# Patient Record
Sex: Female | Born: 1970 | Race: White | Hispanic: No | Marital: Married | Smoking: Never smoker
Health system: Southern US, Community
[De-identification: ages and names within clinical notes are randomized; demographics above are authoritative.]

## PROBLEM LIST (undated history)

## (undated) DIAGNOSIS — G4733 Obstructive sleep apnea (adult) (pediatric): Secondary | ICD-10-CM

## (undated) DIAGNOSIS — I10 Essential (primary) hypertension: Secondary | ICD-10-CM

## (undated) DIAGNOSIS — G2581 Restless legs syndrome: Secondary | ICD-10-CM

## (undated) DIAGNOSIS — R569 Unspecified convulsions: Secondary | ICD-10-CM

## (undated) HISTORY — PX: TUBAL LIGATION: SHX77

## (undated) HISTORY — DX: Obstructive sleep apnea (adult) (pediatric): G47.33

## (undated) HISTORY — DX: Essential (primary) hypertension: I10

## (undated) HISTORY — DX: Restless legs syndrome: G25.81

---

## 1998-07-02 ENCOUNTER — Other Ambulatory Visit: Admission: RE | Admit: 1998-07-02 | Discharge: 1998-07-02 | Payer: Self-pay | Admitting: Family Medicine

## 2006-04-05 ENCOUNTER — Other Ambulatory Visit: Admission: RE | Admit: 2006-04-05 | Discharge: 2006-04-05 | Payer: Self-pay | Admitting: Family Medicine

## 2011-07-31 DEATH — deceased

## 2012-06-08 ENCOUNTER — Other Ambulatory Visit: Payer: Self-pay | Admitting: Family Medicine

## 2012-06-08 ENCOUNTER — Ambulatory Visit (HOSPITAL_COMMUNITY)
Admission: RE | Admit: 2012-06-08 | Discharge: 2012-06-08 | Disposition: A | Discharge status: Home / Self Care | Payer: BC Managed Care – PPO | Source: Ambulatory Visit | Attending: Family Medicine | Admitting: Family Medicine

## 2012-06-08 DIAGNOSIS — R51 Headache: Secondary | ICD-10-CM

## 2013-11-14 ENCOUNTER — Ambulatory Visit (INDEPENDENT_AMBULATORY_CARE_PROVIDER_SITE_OTHER): Payer: BC Managed Care – PPO | Admitting: Family Medicine

## 2013-11-14 VITALS — BP 149/92 | HR 83 | Temp 97.8°F

## 2013-11-14 DIAGNOSIS — R079 Chest pain, unspecified: Secondary | ICD-10-CM

## 2013-11-14 NOTE — Progress Notes (Signed)
   Subjective:    Patient ID: Jenny Brown, female    DOB: 1970-08-18, 43 y.o.   MRN: [id number]  HPI This 43 y.o. female presents for evaluation of having substernal chest pain radiating down left arm. She has had episode earlier today and she wanted to come here and be evaluated instead of going to ED as advised.  She states she is not having chest pressure or pain at present.   Review of Systems C/o chest pain   No  SOB, HA, dizziness, vision change, N/V, diarrhea, constipation, dysuria, urinary urgency or frequency, myalgias, arthralgias or rash.  Objective:   Physical Exam  Vital signs noted  Well developed well nourished female.  HEENT - Head atraumatic Normocephalic                Eyes - PERRLA, Conjuctiva - clear Sclera- Clear EOMI                Ears - EAC's Wnl TM's Wnl Gross Hearing WNL                Throat - oropharanx wnl Respiratory - Lungs CTA bilateral Cardiac - RRR S1 and S2 without murmur GI - Abdomen soft Nontender and bowel sounds active x 4 Extremities - No edema. Neuro - Grossly intact.  EKG - NSR w/o acute st-t changes    Assessment & Plan:  Chest pain Advised patient that she needs to be seen in ED for chest pain and she declines EMS transport and will have her husband take her to ED.  She leaves ama and goes to her vehicle to go to ED.  Lysbeth Penner FNP

## 2014-03-12 ENCOUNTER — Ambulatory Visit (INDEPENDENT_AMBULATORY_CARE_PROVIDER_SITE_OTHER): Payer: BC Managed Care – PPO | Admitting: Family Medicine

## 2014-03-12 ENCOUNTER — Encounter: Payer: Self-pay | Admitting: Family Medicine

## 2014-03-12 VITALS — BP 172/95 | HR 61 | Temp 97.8°F | Ht 61.75 in | Wt 172.0 lb

## 2014-03-12 DIAGNOSIS — R5381 Other malaise: Secondary | ICD-10-CM

## 2014-03-12 DIAGNOSIS — G4710 Hypersomnia, unspecified: Secondary | ICD-10-CM

## 2014-03-12 DIAGNOSIS — R51 Headache: Secondary | ICD-10-CM

## 2014-03-12 DIAGNOSIS — R5383 Other fatigue: Secondary | ICD-10-CM

## 2014-03-12 DIAGNOSIS — G4700 Insomnia, unspecified: Secondary | ICD-10-CM

## 2014-03-12 DIAGNOSIS — R519 Headache, unspecified: Secondary | ICD-10-CM

## 2014-03-12 DIAGNOSIS — I10 Essential (primary) hypertension: Secondary | ICD-10-CM

## 2014-03-12 DIAGNOSIS — G4733 Obstructive sleep apnea (adult) (pediatric): Secondary | ICD-10-CM

## 2014-03-12 DIAGNOSIS — G2581 Restless legs syndrome: Secondary | ICD-10-CM

## 2014-03-12 DIAGNOSIS — R079 Chest pain, unspecified: Secondary | ICD-10-CM

## 2014-03-12 LAB — POCT CBC
Granulocyte percent: 60.9 %G (ref 37–80)
HCT, POC: 35.5 % — AB (ref 37.7–47.9)
Hemoglobin: 11.4 g/dL — AB (ref 12.2–16.2)
Lymph, poc: 2.4 (ref 0.6–3.4)
MCH, POC: 22.7 pg — AB (ref 27–31.2)
MCHC: 32.1 g/dL (ref 31.8–35.4)
MCV: 70.8 fL — AB (ref 80–97)
MPV: 7.8 fL (ref 0–99.8)
POC Granulocyte: 4.6 (ref 2–6.9)
POC LYMPH PERCENT: 32.4 %L (ref 10–50)
Platelet Count, POC: 302 10*3/uL (ref 142–424)
RBC: 5 M/uL (ref 4.04–5.48)
RDW, POC: 15 %
WBC: 7.5 10*3/uL (ref 4.6–10.2)

## 2014-03-12 MED ORDER — METOPROLOL TARTRATE 50 MG PO TABS: 50.0000 mg | ORAL_TABLET | Freq: Two times a day (BID) | ORAL | Status: DC

## 2014-03-12 MED ORDER — CARBIDOPA-LEVODOPA ER 50-200 MG PO TBCR: 1.0000 | EXTENDED_RELEASE_TABLET | Freq: Every day | ORAL | Status: DC

## 2014-03-12 MED ORDER — BUTALBITAL-APAP-CAFFEINE 50-325-40 MG PO TABS: 1.0000 | ORAL_TABLET | Freq: Four times a day (QID) | ORAL | Status: AC | PRN

## 2014-03-12 MED ORDER — METHYLPREDNISOLONE (PAK) 4 MG PO TABS: ORAL_TABLET | ORAL | Status: DC

## 2014-03-12 MED ORDER — CLONIDINE HCL 0.1 MG PO TABS: ORAL_TABLET | ORAL | Status: DC

## 2014-03-12 NOTE — Progress Notes (Signed)
   Subjective:    Patient ID: Jenny Brown, female    DOB: 18-Sep-1970, 43 y.o.   MRN: [id number]  HPI Patient is having frequent headaches 2-3 a week.  The headahes are stress headaches with mixed migraine features.  Her bp is elevated when she has headaches.  Her bp at her gynecologist was 180/110.  She has nausea, visual disturbance, and phonophotophobia.  She was in for chest pain 5/15 and left AMA and did not go to the ED.  She has RLS sx's and insomnia.  She snores loudly and her husband has told her she has stopped breathing at night.  She is c/o fatigue and hypersomnolence. She states she does still get intermittant chest pains and had chest pain radiating to her left jaw just the other day.  She was advised to go see her doctor by the school nurse.  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   Review of Systems C/o fatigue, headache, RLS, insomnia, snoring, fatigue, and chest pain.   No SOB, dizziness, vision change, N/V, diarrhea, constipation, dysuria, urinary urgency or frequency, myalgias, arthralgias or rash.  Objective:   Physical Exam Vital signs noted  Well developed well nourished female.  HEENT - Head atraumatic Normocephalic                Eyes - PERRLA, Conjuctiva - clear Sclera- Clear EOMI                Ears - EAC's Wnl TM's Wnl Gross Hearing WNL                Nose - Nares patent                 Throat - oropharanx wnl Respiratory - Lungs CTA bilateral Cardiac - RRR S1 and S2 without murmur GI - Abdomen soft Nontender and bowel sounds active x 4 Extremities - No edema. Neuro - Grossly intact.       Assessment & Plan:  Generalized headaches - Plan: methylPREDNIsolone (MEDROL DOSPACK) 4 MG tablet, butalbital-acetaminophen-caffeine (FIORICET) 50-325-40 MG per tablet  Essential hypertension, benign - Plan: metoprolol (LOPRESSOR) 50 MG  Po bid  Insomnia - Plan: cloNIDine (CATAPRES) 0.1 MG tablet po qhs for insomnia and hot  flashes  RLS (restless legs syndrome) - Plan: carbidopa-levodopa (SINEMET CR) 50-200 MG per tablet  Hypersomnolence - Plan: POCT CBC, CMP14+EGFR, Lipid panel, TSH, Vit D  25 hydroxy (rtn osteoporosis monitoring)  Obstructive sleep apnea - Plan: Ambulatory referral to Pulmonology  Other malaise and fatigue - Plan: POCT CBC, CMP14+EGFR, Lipid panel, TSH, Vit D  25 hydroxy (rtn osteoporosis monitoring), Vitamin B12  Chest pain, unspecified - Plan: Ambulatory referral to Cardiology  Discussed with patient that she does not need to work until her bp is controlled and until she is seen By a cardiologist.  Discussed she needs to go to the ED if developing any chest pain. Follow up in 2 weeks.  Lysbeth Penner FNP

## 2014-03-13 LAB — LIPID PANEL
Chol/HDL Ratio: 4.8 ratio units — ABNORMAL HIGH (ref 0.0–4.4)
Cholesterol, Total: 178 mg/dL (ref 100–199)
HDL: 37 mg/dL — ABNORMAL LOW (ref >39)
LDL Calculated: 112 mg/dL — ABNORMAL HIGH (ref 0–99)
Triglycerides: 147 mg/dL (ref 0–149)
VLDL Cholesterol Cal: 29 mg/dL (ref 5–40)

## 2014-03-13 LAB — CMP14+EGFR
ALT: 11 IU/L (ref 0–32)
AST: 12 IU/L (ref 0–40)
Albumin/Globulin Ratio: 1.6 (ref 1.1–2.5)
Albumin: 4.1 g/dL (ref 3.5–5.5)
Alkaline Phosphatase: 54 IU/L (ref 39–117)
BUN/Creatinine Ratio: 15 (ref 9–23)
BUN: 10 mg/dL (ref 6–24)
CO2: 22 mmol/L (ref 18–29)
Calcium: 9.2 mg/dL (ref 8.7–10.2)
Chloride: 101 mmol/L (ref 97–108)
Creatinine, Ser: 0.65 mg/dL (ref 0.57–1.00)
GFR calc Af Amer: 128 mL/min/{1.73_m2} (ref >59)
GFR calc non Af Amer: 111 mL/min/{1.73_m2} (ref >59)
Globulin, Total: 2.6 g/dL (ref 1.5–4.5)
Glucose: 80 mg/dL (ref 65–99)
Potassium: 4 mmol/L (ref 3.5–5.2)
Sodium: 140 mmol/L (ref 134–144)
Total Bilirubin: 0.2 mg/dL (ref 0.0–1.2)
Total Protein: 6.7 g/dL (ref 6.0–8.5)

## 2014-03-13 LAB — VITAMIN B12: Vitamin B-12: 780 pg/mL (ref 211–946)

## 2014-03-13 LAB — TSH: TSH: 1.75 u[IU]/mL (ref 0.450–4.500)

## 2014-03-13 LAB — VITAMIN D 25 HYDROXY (VIT D DEFICIENCY, FRACTURES): Vit D, 25-Hydroxy: 39.7 ng/mL (ref 30.0–100.0)

## 2014-03-14 ENCOUNTER — Telehealth: Payer: Self-pay | Admitting: Family Medicine

## 2014-03-14 NOTE — Telephone Encounter (Signed)
Call given to triage for chest pain

## 2014-03-15 ENCOUNTER — Telehealth: Payer: Self-pay | Admitting: Family Medicine

## 2014-03-19 ENCOUNTER — Encounter: Payer: Self-pay | Admitting: *Deleted

## 2014-03-19 DIAGNOSIS — I10 Essential (primary) hypertension: Secondary | ICD-10-CM | POA: Insufficient documentation

## 2014-03-19 DIAGNOSIS — G4733 Obstructive sleep apnea (adult) (pediatric): Secondary | ICD-10-CM | POA: Insufficient documentation

## 2014-03-19 DIAGNOSIS — R072 Precordial pain: Secondary | ICD-10-CM | POA: Insufficient documentation

## 2014-03-19 NOTE — Progress Notes (Signed)
    Clinical Summary Jenny Brown is a 43 y.o.female referred for cardiology consultation by Mr. Oxford NP. She describes intermittent feelings of thoracic discomfort, usually begins posteriorly on the right and comes around to the front, sometimes associated with a numbness and tingling in her right arm and hand. She has noticed these symptoms since April, there is no specific pattern, not clearly exertional. Not necessarily associated with stress. She also describes feelings of unsteadiness and confusion that have happened over the last several months at work. Not correlated with the above symptoms.  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 She states that she loves her job.  Recent lab work showed hemoglobin 11.4, platelets 302, BUN 10, creatinine 0.6, potassium 4.0, normal LFTs, cholesterol 178, triglycerides 147, HDL 37, LDL 112, TSH 1.7.  ECG today is normal. He has never undergone any ischemic testing.  No Known Allergies  Current Outpatient Prescriptions  Medication Sig Dispense Refill  . butalbital-acetaminophen-caffeine (FIORICET) 50-325-40 MG per tablet Take 1-2 tablets by mouth every 6 (six) hours as needed for headache.  30 tablet  1  . cloNIDine (CATAPRES) 0.1 MG tablet One po qhs prn hot flashes and insomnia  30 tablet  11  . methylPREDNIsolone (MEDROL DOSPACK) 4 MG tablet follow package directions  21 tablet  0  . metoprolol (LOPRESSOR) 50 MG tablet Take 1 tablet (50 mg total) by mouth 2 (two) times daily.  60 tablet  3   No current facility-administered medications for this visit.    Past Medical History  Diagnosis Date  . Essential hypertension, benign   . OSA (obstructive sleep apnea)   . Restless legs syndrome     History reviewed. No pertinent past surgical history.  Family History  Problem Relation Age of Onset  . Heart disease      Social History Ms. Dunford reports that she has never smoked. She does not have any smokeless tobacco  history on file. Ms. Recht reports that she does not drink alcohol.  Review of Systems No palpitations or syncope. Stable appetite. No unusual amount of stress. Other systems reviewed and negative except as outlined.  Physical Examination Filed Vitals:   03/20/14 1015  BP: 152/92  Pulse: 87   Filed Weights   03/20/14 1015  Weight: 171 lb (77.565 kg)   Overweight woman, appears comfortable at rest. HEENT: Conjunctiva and lids normal, oropharynx clear. Neck: Supple, no elevated JVP or carotid bruits, no thyromegaly. Lungs: Clear to auscultation, nonlabored breathing at rest. Cardiac: Regular rate and rhythm, no S3 or significant systolic murmur, no pericardial rub. Abdomen: Soft, nontender, bowel sounds present. Extremities: No pitting edema, distal pulses 2+. Skin: Warm and dry. Musculoskeletal: No kyphosis. Neuropsychiatric: Alert and oriented x3, affect grossly appropriate.   Problem List and Plan   Precordial pain Largely atypical features. Cardiac risk factors include hypertension and family history of heart disease. ECG today is normal. She has not undergone any prior ischemic testing. Plan is to proceed with an exercise echocardiogram (hold Lopressor for the tests). Consider Definity contrast if needed for imaging. We will plan to call her with the results.  Essential hypertension, benign Blood pressure elevated this morning, she has not taken her Lopressor as yet.  OSA (obstructive sleep apnea) Followed by primary care.    Satira Sark, M.D., F.A.C.C.

## 2014-03-20 ENCOUNTER — Encounter: Payer: Self-pay | Admitting: *Deleted

## 2014-03-20 ENCOUNTER — Ambulatory Visit (INDEPENDENT_AMBULATORY_CARE_PROVIDER_SITE_OTHER): Payer: BC Managed Care – PPO | Admitting: Cardiology

## 2014-03-20 ENCOUNTER — Encounter: Payer: Self-pay | Admitting: Cardiology

## 2014-03-20 VITALS — BP 152/92 | HR 87 | Ht 61.0 in | Wt 171.0 lb

## 2014-03-20 DIAGNOSIS — R079 Chest pain, unspecified: Secondary | ICD-10-CM

## 2014-03-20 DIAGNOSIS — R072 Precordial pain: Secondary | ICD-10-CM

## 2014-03-20 DIAGNOSIS — I10 Essential (primary) hypertension: Secondary | ICD-10-CM

## 2014-03-20 DIAGNOSIS — G4733 Obstructive sleep apnea (adult) (pediatric): Secondary | ICD-10-CM

## 2014-03-20 NOTE — Assessment & Plan Note (Signed)
Blood pressure elevated this morning, she has not taken her Lopressor as yet.

## 2014-03-20 NOTE — Assessment & Plan Note (Signed)
Largely atypical features. Cardiac risk factors include hypertension and family history of heart disease. ECG today is normal. She has not undergone any prior ischemic testing. Plan is to proceed with an exercise echocardiogram (hold Lopressor for the tests). Consider Definity contrast if needed for imaging. We will plan to call her with the results.

## 2014-03-20 NOTE — Assessment & Plan Note (Signed)
Followed by primary care.   

## 2014-03-20 NOTE — Patient Instructions (Signed)
Your physician recommends that you continue on your current medications as directed. Please refer to the Current Medication list given to you today. Your physician has requested that you have a stress echocardiogram. For further information please visit [email or web address]. Please follow instruction sheet as given. We will call you with your results.  

## 2014-03-26 ENCOUNTER — Other Ambulatory Visit: Payer: Self-pay | Admitting: Family Medicine

## 2014-03-26 ENCOUNTER — Telehealth: Payer: Self-pay | Admitting: Family Medicine

## 2014-03-26 NOTE — Telephone Encounter (Signed)
Patient advised to drink some fluids and recheck Bp and call me with reading. I advised her I would talk with Rush Landmark and see what he wants to do or if he wants to see her.

## 2014-03-26 NOTE — Telephone Encounter (Signed)
Hold bp medicine and follow up for visit.  Her bp was elevated 6 days ago in cardiologist office and she may need another agent. Would follow up to discuss and hold bp med for now.

## 2014-03-26 NOTE — Telephone Encounter (Signed)
Bp 100/90 and the only med she took this morning was the metoprolol.

## 2014-03-26 NOTE — Telephone Encounter (Signed)
Patient aware and appt scheduled for thurs at 2 with Bill. And patient is going to keep a monitor on BP.

## 2014-03-26 NOTE — Telephone Encounter (Signed)
Hold bp med and follow up

## 2014-03-29 ENCOUNTER — Ambulatory Visit: Payer: BC Managed Care – PPO | Admitting: Family Medicine

## 2014-03-29 ENCOUNTER — Ambulatory Visit (HOSPITAL_COMMUNITY)
Admission: RE | Admit: 2014-03-29 | Discharge: 2014-03-29 | Disposition: A | Discharge status: Home / Self Care | Payer: BC Managed Care – PPO | Source: Ambulatory Visit | Attending: Cardiology | Admitting: Cardiology

## 2014-03-29 ENCOUNTER — Encounter (HOSPITAL_COMMUNITY): Payer: Self-pay

## 2014-03-29 DIAGNOSIS — I10 Essential (primary) hypertension: Secondary | ICD-10-CM | POA: Insufficient documentation

## 2014-03-29 DIAGNOSIS — R079 Chest pain, unspecified: Secondary | ICD-10-CM

## 2014-03-29 MED ORDER — PERFLUTREN LIPID MICROSPHERE
1.0000 mL | INTRAVENOUS | Status: AC | PRN
  Administered 2014-03-29 (×2): 2 mL via INTRAVENOUS
  Filled 2014-03-29: qty 10

## 2014-03-29 NOTE — Progress Notes (Signed)
Stress Lab Nurses Notes - Jenny Brown  Jenny Brown 03/29/2014 Reason for doing test: Chest Pain and Syncope Type of test: Stress Echo Nurse performing test: Gerrit Halls, RN Nuclear Medicine Tech: Not Applicable Echo Tech: Darlina Sicilian MD performing test: S. McDowell/K.Purcell Nails NP Family MD: Laurance Flatten Test explained and consent signed: Yes.   IV started: 22g jelco, Saline lock flushed and No redness or edema Symptoms: Fatigue Treatment/Intervention: None Reason test stopped: fatigue After recovery IV was: Discontinued and No redness or edema Patient to return to [facility]. Med at : [facility] Patient discharged: Home Patient's Condition upon discharge was: stable Comments: During test peak BP 178/93 & HR 179.  Recovery BP 136/77 & HR 106.  Symptoms resolved in recovery. Geanie Cooley T

## 2014-03-29 NOTE — Progress Notes (Signed)
  Echocardiogram Echocardiogram Stress Test has been performed.  Jenny Brown M 03/29/2014, 10:00 AM

## 2014-04-09 ENCOUNTER — Telehealth: Payer: Self-pay | Admitting: Family Medicine

## 2014-04-09 ENCOUNTER — Telehealth: Payer: Self-pay | Admitting: *Deleted

## 2014-04-09 ENCOUNTER — Ambulatory Visit: Payer: BC Managed Care – PPO | Admitting: Family Medicine

## 2014-04-09 NOTE — Telephone Encounter (Signed)
Patient will need to obtain these results from her cardiologist since they were the ordering physician.  Patient stated understanding and will call them.

## 2014-04-09 NOTE — Telephone Encounter (Signed)
Patient called for echo results. Nurse advised patient that MD would respond tomorrow.

## 2014-04-10 ENCOUNTER — Encounter: Payer: Self-pay | Admitting: *Deleted

## 2014-04-10 NOTE — Telephone Encounter (Signed)
Patient informed via my chart.

## 2014-04-10 NOTE — Telephone Encounter (Signed)
Please with her know that the stress test looked good, overall low risk. Argues against significant obstructive CAD at this time.

## 2014-04-26 ENCOUNTER — Institutional Professional Consult (permissible substitution): Payer: BC Managed Care – PPO | Admitting: Pulmonary Disease

## 2014-07-04 ENCOUNTER — Ambulatory Visit (INDEPENDENT_AMBULATORY_CARE_PROVIDER_SITE_OTHER): Payer: BLUE CROSS/BLUE SHIELD | Admitting: Family Medicine

## 2014-07-04 ENCOUNTER — Other Ambulatory Visit: Payer: Self-pay

## 2014-07-04 ENCOUNTER — Encounter: Payer: Self-pay | Admitting: Family Medicine

## 2014-07-04 VITALS — BP 161/104 | HR 97 | Temp 97.3°F | Ht 61.0 in | Wt 173.0 lb

## 2014-07-04 DIAGNOSIS — R5383 Other fatigue: Secondary | ICD-10-CM

## 2014-07-04 DIAGNOSIS — I10 Essential (primary) hypertension: Secondary | ICD-10-CM

## 2014-07-04 DIAGNOSIS — R55 Syncope and collapse: Secondary | ICD-10-CM

## 2014-07-04 DIAGNOSIS — R51 Headache: Secondary | ICD-10-CM

## 2014-07-04 DIAGNOSIS — R519 Headache, unspecified: Secondary | ICD-10-CM

## 2014-07-04 DIAGNOSIS — R413 Other amnesia: Secondary | ICD-10-CM

## 2014-07-04 DIAGNOSIS — R000 Tachycardia, unspecified: Secondary | ICD-10-CM

## 2014-07-04 LAB — POCT CBC
Granulocyte percent: 64.2 %G (ref 37–80)
HCT, POC: 38.2 % (ref 37.7–47.9)
Hemoglobin: 11.5 g/dL — AB (ref 12.2–16.2)
Lymph, poc: 2.4 (ref 0.6–3.4)
MCH, POC: 21 pg — AB (ref 27–31.2)
MCHC: 30.2 g/dL — AB (ref 31.8–35.4)
MCV: 69.5 fL — AB (ref 80–97)
MPV: 7.7 fL (ref 0–99.8)
POC Granulocyte: 5.6 (ref 2–6.9)
POC LYMPH PERCENT: 27.4 %L (ref 10–50)
Platelet Count, POC: 337 10*3/uL (ref 142–424)
RBC: 5.5 M/uL — AB (ref 4.04–5.48)
RDW, POC: 15.9 %
WBC: 8.8 10*3/uL (ref 4.6–10.2)

## 2014-07-04 MED ORDER — AMLODIPINE BESYLATE 5 MG PO TABS: 5.0000 mg | ORAL_TABLET | Freq: Every day | ORAL | Status: DC

## 2014-07-04 NOTE — Progress Notes (Signed)
   Subjective:    Patient ID: Jenny Brown, female    DOB: 18-Sep-1970, 44 y.o.   MRN: [id number]  HPI Patient is here for follow up on syncopal episodes, confusion, headaches, hypertension, memory problems and fatigue. She has c/o SOB and chest pressure.  She has a note from one of her co-workers who states she has been having sz activity and becomes unresponsive.  She was seen in 10/15 for sz's and she was referred to cardiology and was cleared and had normal stress echo.  She c/o rapid heart rate at times.  Review of Systems  Constitutional: Negative for fever.  HENT: Negative for ear pain.   Eyes: Negative for discharge.  Respiratory: Negative for cough.   Cardiovascular: Negative for chest pain.  Gastrointestinal: Negative for abdominal distention.  Endocrine: Negative for polyuria.  Genitourinary: Negative for difficulty urinating.  Musculoskeletal: Negative for gait problem and neck pain.  Skin: Negative for color change and rash.  Neurological: Negative for speech difficulty and headaches.  Psychiatric/Behavioral: Negative for agitation.       Objective:    BP 161/104 mmHg  Pulse 97  Temp(Src) 97.3 F (36.3 C) (Oral)  Ht $R'5\' 1"'WB$  (1.549 m)  Wt 173 lb (78.472 kg)  BMI 32.70 kg/m2  LMP 06/13/2014 (Approximate) Physical Exam  Constitutional: She is oriented to person, place, and time. She appears well-developed and well-nourished.  HENT:  Head: Normocephalic and atraumatic.  Mouth/Throat: Oropharynx is clear and moist.  Eyes: Pupils are equal, round, and reactive to light.  Neck: Normal range of motion. Neck supple.  Cardiovascular: Normal rate and regular rhythm.   No murmur heard. Pulmonary/Chest: Effort normal and breath sounds normal.  Abdominal: Soft. Bowel sounds are normal. There is no tenderness.  Neurological: She is alert and oriented to person, place, and time.  Skin: Skin is warm and dry.  Psychiatric: She has a normal mood and affect.            Assessment & Plan:     ICD-9-CM ICD-10-CM   1. Syncope and collapse 780.2 R55 CT Head W Contrast     Ambulatory referral to Neurology  2. Essential hypertension 401.9 I10 CT Head W Contrast     amLODipine (NORVASC) 5 MG tablet  3. Memory deficits 780.93 R41.3 Ambulatory referral to Neurology  4. Other fatigue 780.79 R53.83 POCT CBC     CMP14+EGFR     Thyroid Panel With TSH     Vitamin B12  5. Generalized headaches 784.0 R51   6. Tachycardia 785.0 R00.0 Holter monitor - 24 hour     Return in about 2 weeks (around 07/18/2014).  Lysbeth Penner FNP

## 2014-07-05 ENCOUNTER — Ambulatory Visit (HOSPITAL_COMMUNITY)
Admission: RE | Admit: 2014-07-05 | Discharge: 2014-07-05 | Disposition: A | Discharge status: Home / Self Care | Payer: BC Managed Care – PPO | Source: Ambulatory Visit | Attending: Family Medicine | Admitting: Family Medicine

## 2014-07-05 DIAGNOSIS — R51 Headache: Secondary | ICD-10-CM | POA: Diagnosis not present

## 2014-07-05 DIAGNOSIS — R410 Disorientation, unspecified: Secondary | ICD-10-CM | POA: Insufficient documentation

## 2014-07-05 DIAGNOSIS — R55 Syncope and collapse: Secondary | ICD-10-CM | POA: Diagnosis present

## 2014-07-05 DIAGNOSIS — R413 Other amnesia: Secondary | ICD-10-CM | POA: Insufficient documentation

## 2014-07-05 DIAGNOSIS — J329 Chronic sinusitis, unspecified: Secondary | ICD-10-CM | POA: Insufficient documentation

## 2014-07-05 DIAGNOSIS — I10 Essential (primary) hypertension: Secondary | ICD-10-CM | POA: Diagnosis not present

## 2014-07-05 LAB — CMP14+EGFR
ALT: 9 IU/L (ref 0–32)
AST: 9 IU/L (ref 0–40)
Albumin/Globulin Ratio: 1.3 (ref 1.1–2.5)
Albumin: 4.3 g/dL (ref 3.5–5.5)
Alkaline Phosphatase: 64 IU/L (ref 39–117)
BUN/Creatinine Ratio: 15 (ref 9–23)
BUN: 11 mg/dL (ref 6–24)
CO2: 23 mmol/L (ref 18–29)
Calcium: 9.7 mg/dL (ref 8.7–10.2)
Chloride: 102 mmol/L (ref 97–108)
Creatinine, Ser: 0.71 mg/dL (ref 0.57–1.00)
GFR calc Af Amer: 121 mL/min/{1.73_m2} (ref >59)
GFR calc non Af Amer: 105 mL/min/{1.73_m2} (ref >59)
Globulin, Total: 3.2 g/dL (ref 1.5–4.5)
Glucose: 88 mg/dL (ref 65–99)
Potassium: 4.3 mmol/L (ref 3.5–5.2)
Sodium: 138 mmol/L (ref 134–144)
Total Bilirubin: 0.2 mg/dL (ref 0.0–1.2)
Total Protein: 7.5 g/dL (ref 6.0–8.5)

## 2014-07-05 LAB — THYROID PANEL WITH TSH
Free Thyroxine Index: 1.9 (ref 1.2–4.9)
T3 Uptake Ratio: 24 % (ref 24–39)
T4, Total: 7.8 ug/dL (ref 4.5–12.0)
TSH: 1.47 u[IU]/mL (ref 0.450–4.500)

## 2014-07-05 LAB — VITAMIN B12: Vitamin B-12: 578 pg/mL (ref 211–946)

## 2014-07-05 MED ORDER — SODIUM CHLORIDE 0.9 % IJ SOLN
INTRAMUSCULAR | Status: DC
Start: 2014-07-05 — End: 2014-07-06
  Filled 2014-07-05: qty 600

## 2014-07-05 MED ORDER — IOHEXOL 300 MG/ML  SOLN
75.0000 mL | Freq: Once | INTRAMUSCULAR | Status: AC | PRN
  Administered 2014-07-05: 75 mL via INTRAVENOUS

## 2014-07-18 ENCOUNTER — Ambulatory Visit: Payer: BLUE CROSS/BLUE SHIELD | Admitting: Family

## 2014-07-23 ENCOUNTER — Telehealth: Payer: Self-pay | Admitting: Neurology

## 2014-07-23 NOTE — Telephone Encounter (Signed)
Pt called and canceled appt to see Dr Delice Lesch on 07-24-14 and resch 08-30-14

## 2014-07-24 ENCOUNTER — Ambulatory Visit: Payer: BC Managed Care – PPO | Admitting: Neurology

## 2014-08-30 ENCOUNTER — Encounter: Payer: Self-pay | Admitting: Neurology

## 2014-08-30 ENCOUNTER — Ambulatory Visit (INDEPENDENT_AMBULATORY_CARE_PROVIDER_SITE_OTHER): Payer: BC Managed Care – PPO | Admitting: Neurology

## 2014-08-30 VITALS — BP 160/100 | HR 87 | Ht 61.0 in | Wt 177.4 lb

## 2014-08-30 DIAGNOSIS — R519 Headache, unspecified: Secondary | ICD-10-CM

## 2014-08-30 DIAGNOSIS — R51 Headache: Secondary | ICD-10-CM

## 2014-08-30 DIAGNOSIS — R404 Transient alteration of awareness: Secondary | ICD-10-CM

## 2014-08-30 DIAGNOSIS — R292 Abnormal reflex: Secondary | ICD-10-CM

## 2014-08-30 DIAGNOSIS — R200 Anesthesia of skin: Secondary | ICD-10-CM

## 2014-08-30 LAB — CREATININE, SERUM: Creat: 0.6 mg/dL (ref 0.50–1.10)

## 2014-08-30 LAB — BUN: BUN: 8 mg/dL (ref 6–23)

## 2014-08-30 NOTE — Patient Instructions (Signed)
1. Routine EEG today 2. Schedule MRI brain with and without contrast 3. Schedule MRI cervical spine with and without contrast 4. As per [place] driving laws, after any episode of loss of consciousness/awareness, one should not drive until 6 months event-free 5. Follow-up after the tests

## 2014-08-30 NOTE — Progress Notes (Signed)
NEUROLOGY CONSULTATION NOTE  Jenny Brown MRN: [id number] DOB: 1971/02/22  Referring provider: Stevan Born, FNP Primary care provider: Stevan Born, FNP  Reason for consult:  Loss of consciousness, numbness, headaches  Thank you for your kind referral of Jenny Brown for consultation of the above symptoms. Although her history is well known to you, please allow me to reiterate it for the purpose of our medical record. Records and images were personally reviewed where available.  HISTORY OF PRESENT ILLNESS: This is a 44 year old right-handed woman with a history of hypertension, tachycardia, presenting for evaluation of episodes of shaking an unresponsiveness. She brings written reports from 2 of her co-workers. She was in her usual state of health until 06/08/14 when she woke up feeling unwell with a massive headache. She had a diffuse pressure with nausea, no photo/phonophobia but went to work. She works as a Consulting civil engineer and recalls going to the gym then feeling disoriented as to where she was. She got scared and went outside, telling her co-workers that something was wrong. They brought her to a room, then she has no recollection of events until she heard EMS telling her to go to the hospital, which she refused. According to one witness account, she was noticed to be walking off balanced, and when talking she was confused, short of breath, and very flushed. They noticed she had a very distant far off look as if in a daze. She began to complain of difficulty speaking and swallowing, and having a really bad headache. They asked her to take an aspirin and she could not recall where her purse was or who she had spoken to. They found her purse and gave her an aspirin, then noted she was having difficulty swallowing it to the point she was gagging and about to vomit. They took her out and she started shaking, becoming unresponsive, limp, eyes rolling back and fluttering quickly, her head  flopping from side to side and back and forth because she couldn't keep it up. When she came to, she was very hazy, with a bad headache and disoriented. 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 She forgot what she was supposed to be doing, and was very tired and a little disoriented. She was noted to slump down and stop talking, her eyes were not focusing and seemed to dance around. She took the aspirin but could not recall where her purse was. It took her a while to swallow the aspirin, she kept opening and closing her eyes, eyes continued to jump. She was not responding to her name for 5-10 minutes, then started talking as the paramedics arrived.  Jenny Brown recalls she was tired for 2-3 days after this event. Around 2 months ago, she was at home and again not feeling well with a bad headache, then she started shaking and felt she could not stop it. This lasted a few minutes then she felt tired and slept the whole weekend. The last episode occurred a few weeks ago, she started feeling sick again with a headache, nausea, then started shaking for 5-6 minutes. Her husband was talking to her but she was not responding. Her husband said her legs were jumping. She had felt tired that whole day and couldn't focus in school earlier. She slept for 3 days after. Since then, she has had a sore throat with difficulty swallowing. She  has noticed that with these episodes, her right arm starts hurting with slight tingling and numbness, like she did something to her elbow, lasting 15-20 minutes then easing off.  She has also noticed that over the past year, she has had numbness and tingling in both arms and legs. She has been having pain in her legs, where it feels like "my veins are coming out." Numbness is worse in both feet that sometimes she cannot feel them. These occur on a near daily basis. Since  December, she has also noticed memory issues and has gotten lost going to places she is usually familiar with. She uses a lot of sticky notes now. She has been told by her husband that she is "not there" for 5-10 minutes, around once a week. Since December, she has been having headaches twice a week, taking aspirin or Aleve, however pain comes back after 6-7 hours where she feels sick, unable to think or focus, no focal numbness/tingling/weakness with the headaches. She denies a prior history of migraines. She occasionally feels dizzy and lightheaded even while sitting, lasting a few seconds where she cannot focus. She had seen the school nurse for this and was told she was not making sense. She denies any neck/back pain, no bowel/bladder dysfunction. She denies any olfactory/gustatory hallucinations. She only has 1 BM a week since childhood. She recalls having dizzy spells from ages 15 to 18. She has palpitations lasting 15 minutes occuring around 3 times a week, separate from the above episodes.   Her mother had seizures since childhood after a bout of rheumatic fever. Her father had seizures from a brain tumor. Otherwise she had a normal birth and early development.  There is no history of febrile convulsions, CNS infections such as meningitis/encephalitis, significant traumatic brain injury, neurosurgical procedures.   PAST MEDICAL HISTORY: Past Medical History  Diagnosis Date  . Essential hypertension, benign   . OSA (obstructive sleep apnea)   . Restless legs syndrome     PAST SURGICAL HISTORY: History reviewed. No pertinent past surgical history.  MEDICATIONS: Current Outpatient Prescriptions on File Prior to Visit  Medication Sig Dispense Refill  . amLODipine (NORVASC) 5 MG tablet Take 1 tablet (5 mg total) by mouth daily. 90 tablet 3  . butalbital-acetaminophen-caffeine (FIORICET) 50-325-40 MG per tablet Take 1-2 tablets by mouth every 6 (six) hours as needed for headache. 30 tablet 1  .  cloNIDine (CATAPRES) 0.1 MG tablet One po qhs prn hot flashes and insomnia 30 tablet 11  . metoprolol (LOPRESSOR) 50 MG tablet Take 1 tablet (50 mg total) by mouth 2 (two) times daily. 60 tablet 3   No current facility-administered medications on file prior to visit.    ALLERGIES: No Known Allergies  FAMILY HISTORY: Family History  Problem Relation Age of Onset  . Heart disease Mother   . Diabetes Mother     SOCIAL HISTORY: History   Social History  . Marital Status: Married    Spouse Name: N/A  . Number of Children: N/A  . Years of Education: N/A   Occupational History  . Not on file.   Social History Main Topics  . Smoking status: Never Smoker   . Smokeless tobacco: Not on file  . Alcohol Use: No  . Drug Use: No  . Sexual Activity: Not on file   Other Topics Concern  . Not on file   Social History Narrative   Lives with husband and 2 children in a one story home.  Works  as a Optometrist.     REVIEW OF SYSTEMS: Constitutional: No fevers, chills, or sweats, no generalized fatigue, change in appetite Eyes: No visual changes, double vision, eye pain Ear, nose and throat: No hearing loss, ear pain, nasal congestion, sore throat Cardiovascular: No chest pain, +palpitations Respiratory:  No shortness of breath at rest or with exertion, wheezes GastrointestinaI: No diarrhea, abdominal pain, fecal incontinence Genitourinary:  No dysuria, urinary retention or frequency Musculoskeletal:  No neck pain, back pain Integumentary: No rash, pruritus, skin lesions Neurological: as above Psychiatric: No depression, insomnia, anxiety Endocrine: No palpitations, fatigue, diaphoresis, mood swings, change in appetite, change in weight, increased thirst Hematologic/Lymphatic:  No anemia, purpura, petechiae. Allergic/Immunologic: no itchy/runny eyes, nasal congestion, recent allergic reactions, rashes  PHYSICAL EXAM: Filed Vitals:   08/30/14 0746  BP: 160/100  Pulse: 87    General: No acute distress, appears anxious, became tearful during memory testing Head:  Normocephalic/atraumatic Eyes: Fundoscopic exam shows bilateral sharp discs, no vessel changes, exudates, or hemorrhages Neck: supple, no paraspinal tenderness, full range of motion Back: No paraspinal tenderness Heart: regular rate and rhythm Lungs: Clear to auscultation bilaterally. Vascular: No carotid bruits. Skin/Extremities: No rash, no edema Neurological Exam: Mental status: alert and oriented to person, place, and time, no dysarthria or aphasia, Fund of knowledge is appropriate.  Recent and remote memory are intact. 3/3 delayed recall.  Attention and concentration are normal.    Able to name objects and repeat phrases. Cranial nerves: CN I: not tested CN II: pupils equal, round and reactive to light, visual fields intact, fundi unremarkable. CN III, IV, VI:  full range of motion, no nystagmus, no ptosis CN V: facial sensation intact CN VII: upper and lower face symmetric CN VIII: hearing intact to finger rub CN IX, X: gag intact, uvula midline CN XI: sternocleidomastoid and trapezius muscles intact CN XII: tongue midline Bulk & Tone: normal, no fasciculations. Motor: 5/5 throughout with no pronator drift. Sensation: decreased pin, cold, and vibration in a stocking distribution in both LE up to ankles. Intact to light touch, cold, pin on both UE. No extinction to double simultaneous stimulation.  Romberg test negative Deep Tendon Reflexes: brisk +3 throughout, with +Hoffman sign on left, no ankle clonus Plantar responses: downgoing bilaterally Cerebellar: no incoordination on finger to nose, heel to shin. No dysdiadochokinesia Gait: narrow-based and steady, able to tandem walk adequately. Tremor: none  IMPRESSION: This is a 44 year old right-handed woman with a history of hypertension, with new onset episodes of unresponsiveness and shaking since December 2015. Episodes are concerning  for possible seizures. She has had new onset headaches as well. 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 MRI brain with and without contrast and MRI C-spine with and without contrast will be ordered. She will have an EEG today, if normal, she will benefit from prolonged EEG to further classify these symptoms to help guide long-term management. [place] driving laws were discussed with the patient, and she knows to stop driving after an episode of loss of awareness, until 6 months event-free. She will follow-up after the tests.   Thank you for allowing me to participate in the care of this patient. Please do not hesitate to call for any questions or concerns.   Ellouise Newer, M.D.

## 2014-08-31 NOTE — Procedures (Signed)
ELECTROENCEPHALOGRAM REPORT  Date of Study: 08/30/2014  Patient's Name: Jenny Brown MRN: [id number] Date of Birth: 1970/11/17  Referring Provider: Dr. Ellouise Newer  Clinical History: This is a 44 year old woman with new onset recurrent episodes of shaking with decreased responsiveness.  Medications: Norvasc, Lopressor, clonidine  Technical Summary: A multichannel digital EEG recording measured by the international 10-20 system with electrodes applied with paste and impedances below 5000 ohms performed in our laboratory with EKG monitoring in an awake and drowsy patient.  Hyperventilation and photic stimulation were performed.  The digital EEG was referentially recorded, reformatted, and digitally filtered in a variety of bipolar and referential montages for optimal display.    Description: The patient is awake and drowsy during the recording.  During maximal wakefulness, there is a symmetric, medium voltage 10.5-11 Hz posterior dominant rhythm that attenuates with eye opening.  The record is symmetric.  During drowsiness, there is an increase in theta slowing of the background.  Deeper stages of sleep were not seen.  Hyperventilation and photic stimulation did not elicit any abnormalities.  There were no epileptiform discharges or electrographic seizures seen.    EKG lead was unremarkable.  Impression: This awake and drowsy EEG is normal.    Clinical Correlation: A normal EEG does not exclude a clinical diagnosis of epilepsy.  If further clinical questions remain, prolonged EEG may be helpful.  Clinical correlation is advised.   Ellouise Newer, M.D.

## 2014-09-03 ENCOUNTER — Telehealth: Payer: Self-pay | Admitting: Neurology

## 2014-09-03 NOTE — Telephone Encounter (Signed)
Pt called wanting to know if Dr. Delice Lesch can write a letter stating that it is okay for her to work. She states that her work is requesting her to have a Quar[employer] manager.  C/b [phone]

## 2014-09-03 NOTE — Telephone Encounter (Signed)
It is okay to work, however she should not drive. Pls let her know routine EEG normal. I would recommend doing a 24-hour EEG. Thanks

## 2014-09-03 NOTE — Telephone Encounter (Signed)
Please review

## 2014-09-04 NOTE — Telephone Encounter (Signed)
Pt called wanting to follow up on the message from yesterday. Pt is at work right now waiting to clock in but her employment wont Let her until her note is completed.  C/b [phone]

## 2014-09-04 NOTE — Telephone Encounter (Signed)
Spoke with patient. Work note faxed to [phone] attn Ms. Cheek.  Patient was notified about EEG results & advised about getting 24 hour EEG. She will let us know if she wants to proceed with that.

## 2014-09-14 ENCOUNTER — Ambulatory Visit (HOSPITAL_COMMUNITY): Payer: BC Managed Care – PPO

## 2014-09-25 ENCOUNTER — Ambulatory Visit: Payer: BC Managed Care – PPO | Admitting: Neurology

## 2014-12-11 ENCOUNTER — Other Ambulatory Visit: Payer: Self-pay | Admitting: Obstetrics and Gynecology

## 2014-12-11 DIAGNOSIS — R928 Other abnormal and inconclusive findings on diagnostic imaging of breast: Secondary | ICD-10-CM

## 2014-12-14 ENCOUNTER — Ambulatory Visit
Admission: RE | Admit: 2014-12-14 | Discharge: 2014-12-14 | Disposition: A | Discharge status: Home / Self Care | Payer: BC Managed Care – PPO | Source: Ambulatory Visit | Attending: Obstetrics and Gynecology | Admitting: Obstetrics and Gynecology

## 2014-12-14 DIAGNOSIS — R928 Other abnormal and inconclusive findings on diagnostic imaging of breast: Secondary | ICD-10-CM

## 2015-05-27 ENCOUNTER — Other Ambulatory Visit: Payer: Self-pay | Admitting: Obstetrics and Gynecology

## 2015-05-27 DIAGNOSIS — R921 Mammographic calcification found on diagnostic imaging of breast: Secondary | ICD-10-CM

## 2015-12-25 ENCOUNTER — Encounter: Payer: Self-pay | Admitting: Pediatrics

## 2015-12-26 ENCOUNTER — Ambulatory Visit: Payer: BLUE CROSS/BLUE SHIELD | Admitting: Family Medicine

## 2015-12-26 ENCOUNTER — Encounter: Payer: Self-pay | Admitting: Family

## 2015-12-26 ENCOUNTER — Ambulatory Visit (INDEPENDENT_AMBULATORY_CARE_PROVIDER_SITE_OTHER): Payer: BC Managed Care – PPO | Admitting: Family

## 2015-12-26 VITALS — BP 186/94 | HR 64 | Temp 98.0°F | Ht 61.0 in | Wt 167.6 lb

## 2015-12-26 DIAGNOSIS — I10 Essential (primary) hypertension: Secondary | ICD-10-CM

## 2015-12-26 DIAGNOSIS — G4700 Insomnia, unspecified: Secondary | ICD-10-CM | POA: Insufficient documentation

## 2015-12-26 LAB — CMP14+EGFR
ALT: 16 IU/L (ref 0–32)
AST: 20 IU/L (ref 0–40)
Albumin/Globulin Ratio: 1.3 (ref 1.2–2.2)
Albumin: 4 g/dL (ref 3.5–5.5)
Alkaline Phosphatase: 48 IU/L (ref 39–117)
BUN/Creatinine Ratio: 12 (ref 9–23)
BUN: 9 mg/dL (ref 6–24)
Bilirubin Total: 0.2 mg/dL (ref 0.0–1.2)
CALCIUM: 9.1 mg/dL (ref 8.7–10.2)
CO2: 22 mmol/L (ref 18–29)
Chloride: 102 mmol/L (ref 96–106)
Creatinine, Ser: 0.75 mg/dL (ref 0.57–1.00)
GFR calc Af Amer: 113 mL/min/{1.73_m2} (ref >59)
GFR, EST NON AFRICAN AMERICAN: 98 mL/min/{1.73_m2} (ref >59)
GLUCOSE: 79 mg/dL (ref 65–99)
Globulin, Total: 3.2 g/dL (ref 1.5–4.5)
Potassium: 4.2 mmol/L (ref 3.5–5.2)
Sodium: 141 mmol/L (ref 134–144)
TOTAL PROTEIN: 7.2 g/dL (ref 6.0–8.5)

## 2015-12-26 MED ORDER — LISINOPRIL-HYDROCHLOROTHIAZIDE 20-12.5 MG PO TABS: 1.0000 | ORAL_TABLET | Freq: Every day | ORAL | Status: DC

## 2015-12-26 MED ORDER — TRAZODONE HCL 50 MG PO TABS: 25.0000 mg | ORAL_TABLET | Freq: Every evening | ORAL | Status: DC | PRN

## 2015-12-26 NOTE — Patient Instructions (Signed)
DASH Eating Plan °DASH stands for "Dietary Approaches to Stop Hypertension." The DASH eating plan is a healthy eating plan that has been shown to reduce high blood pressure (hypertension). Additional health benefits may include reducing the risk of type 2 diabetes mellitus, heart disease, and stroke. The DASH eating plan may also help with weight loss. °WHAT DO I NEED TO KNOW ABOUT THE DASH EATING PLAN? °For the DASH eating plan, you will follow these general guidelines: °· Choose foods with a percent daily value for sodium of less than 5% (as listed on the food label). °· Use salt-free seasonings or herbs instead of table salt or sea salt. °· Check with your health care provider or pharmacist before using salt substitutes. °· Eat lower-sodium products, often labeled as "lower sodium" or "no salt added." °· Eat fresh foods. °· Eat more vegetables, fruits, and low-fat dairy products. °· Choose whole grains. Look for the word "whole" as the first word in the ingredient list. °· Choose fish and skinless chicken or turkey more often than red meat. Limit fish, poultry, and meat to 6 oz (170 g) each day. °· Limit sweets, desserts, sugars, and sugary drinks. °· Choose heart-healthy fats. °· Limit cheese to 1 oz (28 g) per day. °· Eat more home-cooked food and less restaurant, buffet, and fast food. °· Limit fried foods. °· Cook foods using methods other than frying. °· Limit canned vegetables. If you do use them, rinse them well to decrease the sodium. °· When eating at a restaurant, ask that your food be prepared with less salt, or no salt if possible. °WHAT FOODS CAN I EAT? °Seek help from a dietitian for individual calorie needs. °Grains °Whole grain or whole wheat bread. Pung rice. Whole grain or whole wheat pasta. Quinoa, bulgur, and whole grain cereals. Low-sodium cereals. Corn or whole wheat flour tortillas. Whole grain cornbread. Whole grain crackers. Low-sodium crackers. °Vegetables °Fresh or frozen vegetables  (raw, steamed, roasted, or grilled). Low-sodium or reduced-sodium tomato and vegetable juices. Low-sodium or reduced-sodium tomato sauce and paste. Low-sodium or reduced-sodium canned vegetables.  °Fruits °All fresh, canned (in natural juice), or frozen fruits. °Meat and Other Protein Products °Ground beef (85% or leaner), grass-fed beef, or beef trimmed of fat. Skinless chicken or turkey. Ground chicken or turkey. Pork trimmed of fat. All fish and seafood. Eggs. Dried beans, peas, or lentils. Unsalted nuts and seeds. Unsalted canned beans. °Dairy °Low-fat dairy products, such as skim or 1% milk, 2% or reduced-fat cheeses, low-fat ricotta or cottage cheese, or plain low-fat yogurt. Low-sodium or reduced-sodium cheeses. °Fats and Oils °Tub margarines without trans fats. Light or reduced-fat mayonnaise and salad dressings (reduced sodium). Avocado. Safflower, olive, or canola oils. Natural peanut or almond butter. °Other °Unsalted popcorn and pretzels. °The items listed above may not be a complete list of recommended foods or beverages. Contact your dietitian for more options. °WHAT FOODS ARE NOT RECOMMENDED? °Grains °White bread. White pasta. White rice. Refined cornbread. Bagels and croissants. Crackers that contain trans fat. °Vegetables °Creamed or fried vegetables. Vegetables in a cheese sauce. Regular canned vegetables. Regular canned tomato sauce and paste. Regular tomato and vegetable juices. °Fruits °Dried fruits. Canned fruit in light or heavy syrup. Fruit juice. °Meat and Other Protein Products °Fatty cuts of meat. Ribs, chicken wings, bacon, sausage, bologna, salami, chitterlings, fatback, hot dogs, bratwurst, and packaged luncheon meats. Salted nuts and seeds. Canned beans with salt. °Dairy °Whole or 2% milk, cream, half-and-half, and cream cheese. Whole-fat or sweetened yogurt. Full-fat   cheeses or blue cheese. Nondairy creamers and whipped toppings. Processed cheese, cheese spreads, or cheese  curds. °Condiments °Onion and garlic salt, seasoned salt, table salt, and sea salt. Canned and packaged gravies. Worcestershire sauce. Tartar sauce. Barbecue sauce. Teriyaki sauce. Soy sauce, including reduced sodium. Steak sauce. Fish sauce. Oyster sauce. Cocktail sauce. Horseradish. Ketchup and mustard. Meat flavorings and tenderizers. Bouillon cubes. Hot sauce. Tabasco sauce. Marinades. Taco seasonings. Relishes. °Fats and Oils °Butter, stick margarine, lard, shortening, ghee, and bacon fat. Coconut, palm kernel, or palm oils. Regular salad dressings. °Other °Pickles and olives. Salted popcorn and pretzels. °The items listed above may not be a complete list of foods and beverages to avoid. Contact your dietitian for more information. °WHERE CAN I FIND MORE INFORMATION? °National Heart, Lung, [employer] Institute: www.nhlbi.nih.gov/health/health-topics/topics/dash/ °  °This information is not intended to replace advice given to you by your health care provider. Make sure you discuss any questions you have with your health care provider. °  °Document Released: 06/04/2011 Document Revised: 07/06/2014 Document Reviewed: 04/19/2013 °Elsevier Interactive Patient Education ©2016 [employer] ° °Hypertension °Hypertension, commonly called high blood pressure, is when the force of blood pumping through your arteries is too strong. Your arteries are the blood vessels that carry blood from your heart throughout your body. A blood pressure reading consists of a higher number over a lower number, such as 110/72. The higher number (systolic) is the pressure inside your arteries when your heart pumps. The lower number (diastolic) is the pressure inside your arteries when your heart relaxes. Ideally you want your blood pressure below 120/80. °Hypertension forces your heart to work harder to pump blood. Your arteries may become narrow or stiff. Having untreated or uncontrolled hypertension can cause heart attack, stroke, kidney  disease, and other problems. °RISK FACTORS °Some risk factors for high blood pressure are controllable. Others are not.  °Risk factors you cannot control include:  °· Race. You may be at higher risk if you are African American. °· Age. Risk increases with age. °· Gender. Men are at higher risk than women before age 45 years. After age 65, women are at higher risk than men. °Risk factors you can control include: °· Not getting enough exercise or physical activity. °· Being overweight. °· Getting too much fat, sugar, calories, or salt in your diet. °· Drinking too much alcohol. °SIGNS AND SYMPTOMS °Hypertension does not usually cause signs or symptoms. Extremely high blood pressure (hypertensive crisis) may cause headache, anxiety, shortness of breath, and nosebleed. °DIAGNOSIS °To check if you have hypertension, your health care provider will measure your blood pressure while you are seated, with your arm held at the level of your heart. It should be measured at least twice using the same arm. Certain conditions can cause a difference in blood pressure between your right and left arms. A blood pressure reading that is higher than normal on one occasion does not mean that you need treatment. If it is not clear whether you have high blood pressure, you may be asked to return on a different day to have your blood pressure checked again. Or, you may be asked to monitor your blood pressure at home for 1 or more weeks. °TREATMENT °Treating high blood pressure includes making lifestyle changes and possibly taking medicine. Living a healthy lifestyle can help lower high blood pressure. You may need to change some of your habits. °Lifestyle changes may include: °· Following the DASH diet. This diet is high in fruits, vegetables, and whole   grains. It is low in salt, red meat, and added sugars. °· Keep your sodium intake below 2,300 mg per day. °· Getting at least 30-45 minutes of aerobic exercise at least 4 times per  week. °· Losing weight if necessary. °· Not smoking. °· Limiting alcoholic beverages. °· Learning ways to reduce stress. °Your health care provider may prescribe medicine if lifestyle changes are not enough to get your blood pressure under control, and if one of the following is true: °· You are 18-59 years of age and your systolic blood pressure is above 140. °· You are 60 years of age or older, and your systolic blood pressure is above 150. °· Your diastolic blood pressure is above 90. °· You have diabetes, and your systolic blood pressure is over 140 or your diastolic blood pressure is over 90. °· You have kidney disease and your blood pressure is above 140/90. °· You have heart disease and your blood pressure is above 140/90. °Your personal target blood pressure may vary depending on your medical conditions, your age, and other factors. °HOME CARE INSTRUCTIONS °· Have your blood pressure rechecked as directed by your health care provider.   °· Take medicines only as directed by your health care provider. Follow the directions carefully. Blood pressure medicines must be taken as prescribed. The medicine does not work as well when you skip doses. Skipping doses also puts you at risk for problems. °· Do not smoke.   °· Monitor your blood pressure at home as directed by your health care provider.  °SEEK MEDICAL CARE IF:  °· You think you are having a reaction to medicines taken. °· You have recurrent headaches or feel dizzy. °· You have swelling in your ankles. °· You have trouble with your vision. °SEEK IMMEDIATE MEDICAL CARE IF: °· You develop a severe headache or confusion. °· You have unusual weakness, numbness, or feel faint. °· You have severe chest or abdominal pain. °· You vomit repeatedly. °· You have trouble breathing. °MAKE SURE YOU:  °· Understand these instructions. °· Will watch your condition. °· Will get help right away if you are not doing well or get worse. °  °This information is not intended to  replace advice given to you by your health care provider. Make sure you discuss any questions you have with your health care provider. °  °Document Released: 06/15/2005 Document Revised: 10/30/2014 Document Reviewed: 04/07/2013 °Elsevier Interactive Patient Education ©2016 [employer]. ° °

## 2015-12-26 NOTE — Progress Notes (Signed)
   Subjective:    Patient ID: Jenny Brown, female    DOB: 08-11-1970, 45 y.o.   MRN: [id number]  Hypertension This is a chronic problem. The current episode started more than 1 year ago. The problem has been waxing and waning since onset. Associated symptoms include anxiety and headaches. Pertinent negatives include no palpitations, peripheral edema or shortness of breath. Risk factors for coronary artery disease include obesity and family history. Past treatments include nothing. The current treatment provides no improvement. There is no history of kidney disease, CAD/MI, CVA or heart failure.  Insomnia Primary symptoms: difficulty falling asleep.  The current episode started more than one year. The onset quality is gradual. The problem occurs nightly. The problem has been waxing and waning since onset. Past treatments include meditation. The treatment provided mild relief.      Review of Systems  Constitutional: Negative.   Eyes: Negative.   Respiratory: Negative.  Negative for shortness of breath.   Cardiovascular: Negative.  Negative for palpitations.  Gastrointestinal: Negative.   Endocrine: Negative.   Genitourinary: Negative.   Musculoskeletal: Negative.   Neurological: Positive for headaches.  Hematological: Negative.   Psychiatric/Behavioral: The patient has insomnia.   All other systems reviewed and are negative.      Objective:   Physical Exam  Constitutional: She is oriented to person, place, and time. She appears well-developed and well-nourished. No distress.  HENT:  Head: Normocephalic and atraumatic.  Eyes: Pupils are equal, round, and reactive to light.  Neck: Normal range of motion. Neck supple. No thyromegaly present.  Cardiovascular: Normal rate, regular rhythm, normal heart sounds and intact distal pulses.   No murmur heard. Pulmonary/Chest: Effort normal and breath sounds normal. No respiratory distress. She has no wheezes.  Abdominal: Soft. Bowel sounds  are normal. She exhibits no distension. There is no tenderness.  Musculoskeletal: Normal range of motion. She exhibits no edema or tenderness.  Neurological: She is alert and oriented to person, place, and time.  Skin: Skin is warm and dry.  Psychiatric: She has a normal mood and affect. Her behavior is normal. Judgment and thought content normal.  Vitals reviewed.   BP 186/94 mmHg  Pulse 64  Temp(Src) 98 F (36.7 C) (Oral)  Ht _0  (1.549 m)  Wt 167 lb 9.6 oz (76.023 kg)  BMI 31.68 kg/m2       Assessment & Plan:  1. Essential hypertension, benign -PT started on Zestoretic 20-12.5 mg today --Daily blood pressure log given with instructions on how to fill out and told to bring to next visit -Dash diet information given -Exercise encouraged - Stress Management  -Continue current meds -RTO in 2 weeks to recheck - lisinopril-hydrochlorothiazide (ZESTORETIC) 20-12.5 MG tablet; Take 1 tablet by mouth daily.  Dispense: 90 tablet; Refill: 3 - CMP14+EGFR  2. Insomnia -Pt started on Trazodone 50 mg today -Sleep ritual discussed - traZODone (DESYREL) 50 MG tablet; Take 0.5-1 tablets (25-50 mg total) by mouth at bedtime as needed for sleep.  Dispense: 45 tablet; Refill: 3 - [place], FNP

## 2016-01-10 ENCOUNTER — Encounter: Payer: Self-pay | Admitting: Family

## 2016-01-10 ENCOUNTER — Ambulatory Visit (INDEPENDENT_AMBULATORY_CARE_PROVIDER_SITE_OTHER): Payer: BC Managed Care – PPO | Admitting: Family

## 2016-01-10 VITALS — BP 127/82 | HR 74 | Temp 97.1°F | Ht 61.0 in | Wt 165.2 lb

## 2016-01-10 DIAGNOSIS — I10 Essential (primary) hypertension: Secondary | ICD-10-CM | POA: Diagnosis not present

## 2016-01-10 LAB — BMP8+EGFR
BUN/Creatinine Ratio: 15 (ref 9–23)
BUN: 13 mg/dL (ref 6–24)
CO2: 21 mmol/L (ref 18–29)
CREATININE: 0.84 mg/dL (ref 0.57–1.00)
Calcium: 9.1 mg/dL (ref 8.7–10.2)
Chloride: 100 mmol/L (ref 96–106)
GFR calc Af Amer: 98 mL/min/{1.73_m2} (ref >59)
GFR, EST NON AFRICAN AMERICAN: 85 mL/min/{1.73_m2} (ref >59)
GLUCOSE: 91 mg/dL (ref 65–99)
Potassium: 4.7 mmol/L (ref 3.5–5.2)
SODIUM: 137 mmol/L (ref 134–144)

## 2016-01-10 NOTE — Patient Instructions (Signed)
DASH Eating Plan °DASH stands for "Dietary Approaches to Stop Hypertension." The DASH eating plan is a healthy eating plan that has been shown to reduce high blood pressure (hypertension). Additional health benefits may include reducing the risk of type 2 diabetes mellitus, heart disease, and stroke. The DASH eating plan may also help with weight loss. °WHAT DO I NEED TO KNOW ABOUT THE DASH EATING PLAN? °For the DASH eating plan, you will follow these general guidelines: °· Choose foods with a percent daily value for sodium of less than 5% (as listed on the food label). °· Use salt-free seasonings or herbs instead of table salt or sea salt. °· Check with your health care provider or pharmacist before using salt substitutes. °· Eat lower-sodium products, often labeled as "lower sodium" or "no salt added." °· Eat fresh foods. °· Eat more vegetables, fruits, and low-fat dairy products. °· Choose whole grains. Look for the word "whole" as the first word in the ingredient list. °· Choose fish and skinless chicken or turkey more often than red meat. Limit fish, poultry, and meat to 6 oz (170 g) each day. °· Limit sweets, desserts, sugars, and sugary drinks. °· Choose heart-healthy fats. °· Limit cheese to 1 oz (28 g) per day. °· Eat more home-cooked food and less restaurant, buffet, and fast food. °· Limit fried foods. °· Cook foods using methods other than frying. °· Limit canned vegetables. If you do use them, rinse them well to decrease the sodium. °· When eating at a restaurant, ask that your food be prepared with less salt, or no salt if possible. °WHAT FOODS CAN I EAT? °Seek help from a dietitian for individual calorie needs. °Grains °Whole grain or whole wheat bread. [place] rice. Whole grain or whole wheat pasta. Quinoa, bulgur, and whole grain cereals. Low-sodium cereals. Corn or whole wheat flour tortillas. Whole grain cornbread. Whole grain crackers. Low-sodium crackers. °Vegetables °Fresh or frozen vegetables  (raw, steamed, roasted, or grilled). Low-sodium or reduced-sodium tomato and vegetable juices. Low-sodium or reduced-sodium tomato sauce and paste. Low-sodium or reduced-sodium canned vegetables.  °Fruits °All fresh, canned (in natural juice), or frozen fruits. °Meat and Other Protein Products °Ground beef (85% or leaner), grass-fed beef, or beef trimmed of fat. Skinless chicken or turkey. Ground chicken or turkey. Pork trimmed of fat. All fish and seafood. Eggs. Dried beans, peas, or lentils. Unsalted nuts and seeds. Unsalted canned beans. °Dairy °Low-fat dairy products, such as skim or 1% milk, 2% or reduced-fat cheeses, low-fat ricotta or cottage cheese, or plain low-fat yogurt. Low-sodium or reduced-sodium cheeses. °Fats and Oils °Tub margarines without trans fats. Light or reduced-fat mayonnaise and salad dressings (reduced sodium). Avocado. Safflower, olive, or canola oils. Natural peanut or almond butter. °Other °Unsalted popcorn and pretzels. °The items listed above may not be a complete list of recommended foods or beverages. Contact your dietitian for more options. °WHAT FOODS ARE NOT RECOMMENDED? °Grains °White bread. White pasta. White rice. Refined cornbread. Bagels and croissants. Crackers that contain trans fat. °Vegetables °Creamed or fried vegetables. Vegetables in a cheese sauce. Regular canned vegetables. Regular canned tomato sauce and paste. Regular tomato and vegetable juices. °Fruits °Dried fruits. Canned fruit in light or heavy syrup. Fruit juice. °Meat and Other Protein Products °Fatty cuts of meat. Ribs, chicken wings, bacon, sausage, bologna, salami, chitterlings, fatback, hot dogs, bratwurst, and packaged luncheon meats. Salted nuts and seeds. Canned beans with salt. °Dairy °Whole or 2% milk, cream, half-and-half, and cream cheese. Whole-fat or sweetened yogurt. Full-fat   cheeses or blue cheese. Nondairy creamers and whipped toppings. Processed cheese, cheese spreads, or cheese  curds. °Condiments °Onion and garlic salt, seasoned salt, table salt, and sea salt. Canned and packaged gravies. Worcestershire sauce. Tartar sauce. Barbecue sauce. Teriyaki sauce. Soy sauce, including reduced sodium. Steak sauce. Fish sauce. Oyster sauce. Cocktail sauce. Horseradish. Ketchup and mustard. Meat flavorings and tenderizers. Bouillon cubes. Hot sauce. Tabasco sauce. Marinades. Taco seasonings. Relishes. °Fats and Oils °Butter, stick margarine, lard, shortening, ghee, and bacon fat. Coconut, palm kernel, or palm oils. Regular salad dressings. °Other °Pickles and olives. Salted popcorn and pretzels. °The items listed above may not be a complete list of foods and beverages to avoid. Contact your dietitian for more information. °WHERE CAN I FIND MORE INFORMATION? °[employer]: www.nhlbi.nih.gov/health/health-topics/topics/dash/ °  °This information is not intended to replace advice given to you by your health care provider. Make sure you discuss any questions you have with your health care provider. °  °Document Released: 06/04/2011 Document Revised: 07/06/2014 Document Reviewed: 04/19/2013 °Elsevier Interactive Patient Education ©2016 [employer] ° °Hypertension °Hypertension, commonly called high blood pressure, is when the force of blood pumping through your arteries is too strong. Your arteries are the blood vessels that carry blood from your heart throughout your body. A blood pressure reading consists of a higher number over a lower number, such as 110/72. The higher number (systolic) is the pressure inside your arteries when your heart pumps. The lower number (diastolic) is the pressure inside your arteries when your heart relaxes. Ideally you want your blood pressure below 120/80. °Hypertension forces your heart to work harder to pump blood. Your arteries may become narrow or stiff. Having untreated or uncontrolled hypertension can cause heart attack, stroke, kidney  disease, and other problems. °RISK FACTORS °Some risk factors for high blood pressure are controllable. Others are not.  °Risk factors you cannot control include:  °· Race. You may be at higher risk if you are African American. °· Age. Risk increases with age. °· Gender. Men are at higher risk than women before age 45 years. After age 65, women are at higher risk than men. °Risk factors you can control include: °· Not getting enough exercise or physical activity. °· Being overweight. °· Getting too much fat, sugar, calories, or salt in your diet. °· Drinking too much alcohol. °SIGNS AND SYMPTOMS °Hypertension does not usually cause signs or symptoms. Extremely high blood pressure (hypertensive crisis) may cause headache, anxiety, shortness of breath, and nosebleed. °DIAGNOSIS °To check if you have hypertension, your health care provider will measure your blood pressure while you are seated, with your arm held at the level of your heart. It should be measured at least twice using the same arm. Certain conditions can cause a difference in blood pressure between your right and left arms. A blood pressure reading that is higher than normal on one occasion does not mean that you need treatment. If it is not clear whether you have high blood pressure, you may be asked to return on a different day to have your blood pressure checked again. Or, you may be asked to monitor your blood pressure at home for 1 or more weeks. °TREATMENT °Treating high blood pressure includes making lifestyle changes and possibly taking medicine. Living a healthy lifestyle can help lower high blood pressure. You may need to change some of your habits. °Lifestyle changes may include: °· Following the DASH diet. This diet is high in fruits, vegetables, and whole   grains. It is low in salt, red meat, and added sugars. °· Keep your sodium intake below 2,300 mg per day. °· Getting at least 30-45 minutes of aerobic exercise at least 4 times per  week. °· Losing weight if necessary. °· Not smoking. °· Limiting alcoholic beverages. °· Learning ways to reduce stress. °Your health care provider may prescribe medicine if lifestyle changes are not enough to get your blood pressure under control, and if one of the following is true: °· You are 18-59 years of age and your systolic blood pressure is above 140. °· You are 60 years of age or older, and your systolic blood pressure is above 150. °· Your diastolic blood pressure is above 90. °· You have diabetes, and your systolic blood pressure is over 140 or your diastolic blood pressure is over 90. °· You have kidney disease and your blood pressure is above 140/90. °· You have heart disease and your blood pressure is above 140/90. °Your personal target blood pressure may vary depending on your medical conditions, your age, and other factors. °HOME CARE INSTRUCTIONS °· Have your blood pressure rechecked as directed by your health care provider.   °· Take medicines only as directed by your health care provider. Follow the directions carefully. Blood pressure medicines must be taken as prescribed. The medicine does not work as well when you skip doses. Skipping doses also puts you at risk for problems. °· Do not smoke.   °· Monitor your blood pressure at home as directed by your health care provider.  °SEEK MEDICAL CARE IF:  °· You think you are having a reaction to medicines taken. °· You have recurrent headaches or feel dizzy. °· You have swelling in your ankles. °· You have trouble with your vision. °SEEK IMMEDIATE MEDICAL CARE IF: °· You develop a severe headache or confusion. °· You have unusual weakness, numbness, or feel faint. °· You have severe chest or abdominal pain. °· You vomit repeatedly. °· You have trouble breathing. °MAKE SURE YOU:  °· Understand these instructions. °· Will watch your condition. °· Will get help right away if you are not doing well or get worse. °  °This information is not intended to  replace advice given to you by your health care provider. Make sure you discuss any questions you have with your health care provider. °  °Document Released: 06/15/2005 Document Revised: 10/30/2014 Document Reviewed: 04/07/2013 °Elsevier Interactive Patient Education ©2016 [employer] ° °

## 2016-01-10 NOTE — Progress Notes (Signed)
   Subjective:    Patient ID: Jenny Brown, female    DOB: [birth year], 45 y.o.   MRN: [id number]  Pt presents to the office today to recheck HTN. PT's BP is at goal today! Hypertension This is a chronic problem. The current episode started more than 1 year ago. The problem has been resolved since onset. The problem is controlled. Pertinent negatives include no anxiety, headaches, palpitations, peripheral edema or shortness of breath. Risk factors for coronary artery disease include obesity and family history. Past treatments include ACE inhibitors and diuretics. The current treatment provides no improvement. There is no history of kidney disease, CAD/MI, CVA or heart failure.      Review of Systems  Constitutional: Negative.   HENT: Negative.   Eyes: Negative.   Respiratory: Negative.  Negative for shortness of breath.   Cardiovascular: Negative.  Negative for palpitations.  Gastrointestinal: Negative.   Endocrine: Negative.   Genitourinary: Negative.   Musculoskeletal: Negative.   Neurological: Negative.  Negative for headaches.  Hematological: Negative.   Psychiatric/Behavioral: Negative.   All other systems reviewed and are negative.      Objective:   Physical Exam  Constitutional: She is oriented to person, place, and time. She appears well-developed and well-nourished. No distress.  HENT:  Head: Normocephalic.  Eyes: Pupils are equal, round, and reactive to light.  Cardiovascular: Normal rate, regular rhythm, normal heart sounds and intact distal pulses.   No murmur heard. Pulmonary/Chest: Effort normal and breath sounds normal. No respiratory distress. She has no wheezes.  Abdominal: Soft. Bowel sounds are normal. She exhibits no distension. There is no tenderness.  Musculoskeletal: Normal range of motion. She exhibits no edema or tenderness.  Neurological: She is alert and oriented to person, place, and time. She has normal reflexes. No cranial nerve deficit.  Skin: Skin  is warm and dry.  Psychiatric: She has a normal mood and affect. Her behavior is normal. Judgment and thought content normal.  Vitals reviewed.   BP 127/82 mmHg  Pulse 74  Temp(Src) 97.1 F (36.2 C) (Oral)  Ht '5\' 1"'$  (1.549 m)  Wt 165 lb 3.2 oz (74.934 kg)  BMI 31.23 kg/m2       Assessment & Plan:  1. Essential hypertension, benign --Daily blood pressure log given with instructions on how to fill out and told to bring to next visit -Dash diet information given -Exercise encouraged - Stress Management  -Continue current meds -RTO in 1 year - BMP8+EGFR   Continue all meds Labs pending Health Maintenance reviewed Diet and exercise encouraged RTO 1 year  Evelina Dun, FNP

## 2016-02-10 ENCOUNTER — Ambulatory Visit
Admission: RE | Admit: 2016-02-10 | Discharge: 2016-02-10 | Disposition: A | Discharge status: Home / Self Care | Payer: BC Managed Care – PPO | Source: Ambulatory Visit | Attending: Obstetrics and Gynecology | Admitting: Obstetrics and Gynecology

## 2016-02-10 DIAGNOSIS — R921 Mammographic calcification found on diagnostic imaging of breast: Secondary | ICD-10-CM

## 2016-03-16 ENCOUNTER — Telehealth: Payer: Self-pay | Admitting: Pediatrics

## 2016-03-17 ENCOUNTER — Ambulatory Visit: Payer: BC Managed Care – PPO | Admitting: Family

## 2016-04-23 ENCOUNTER — Observation Stay (HOSPITAL_COMMUNITY)
Admission: EM | Admit: 2016-04-23 | Discharge: 2016-04-24 | Disposition: A | Discharge status: Home / Self Care | Payer: Worker's Compensation | Attending: Internal Medicine | Admitting: Internal Medicine

## 2016-04-23 ENCOUNTER — Observation Stay (HOSPITAL_COMMUNITY): Payer: Worker's Compensation

## 2016-04-23 ENCOUNTER — Emergency Department (HOSPITAL_COMMUNITY): Payer: Worker's Compensation

## 2016-04-23 ENCOUNTER — Encounter (HOSPITAL_COMMUNITY): Payer: Self-pay | Admitting: Emergency Medicine

## 2016-04-23 ENCOUNTER — Ambulatory Visit (INDEPENDENT_AMBULATORY_CARE_PROVIDER_SITE_OTHER): Payer: Worker's Compensation | Admitting: Pediatrics

## 2016-04-23 VITALS — BP 158/98 | HR 75 | Temp 97.7°F

## 2016-04-23 DIAGNOSIS — F0781 Postconcussional syndrome: Secondary | ICD-10-CM | POA: Diagnosis not present

## 2016-04-23 DIAGNOSIS — R531 Weakness: Secondary | ICD-10-CM

## 2016-04-23 DIAGNOSIS — N920 Excessive and frequent menstruation with regular cycle: Secondary | ICD-10-CM | POA: Insufficient documentation

## 2016-04-23 DIAGNOSIS — Y990 Civilian activity done for income or pay: Secondary | ICD-10-CM | POA: Insufficient documentation

## 2016-04-23 DIAGNOSIS — D509 Iron deficiency anemia, unspecified: Secondary | ICD-10-CM | POA: Insufficient documentation

## 2016-04-23 DIAGNOSIS — S0990XA Unspecified injury of head, initial encounter: Secondary | ICD-10-CM | POA: Diagnosis not present

## 2016-04-23 DIAGNOSIS — R6889 Other general symptoms and signs: Secondary | ICD-10-CM

## 2016-04-23 DIAGNOSIS — R4182 Altered mental status, unspecified: Secondary | ICD-10-CM

## 2016-04-23 DIAGNOSIS — D500 Iron deficiency anemia secondary to blood loss (chronic): Secondary | ICD-10-CM

## 2016-04-23 DIAGNOSIS — D649 Anemia, unspecified: Secondary | ICD-10-CM

## 2016-04-23 DIAGNOSIS — I10 Essential (primary) hypertension: Secondary | ICD-10-CM | POA: Insufficient documentation

## 2016-04-23 DIAGNOSIS — S060X9A Concussion with loss of consciousness of unspecified duration, initial encounter: Secondary | ICD-10-CM | POA: Diagnosis not present

## 2016-04-23 DIAGNOSIS — M549 Dorsalgia, unspecified: Secondary | ICD-10-CM

## 2016-04-23 DIAGNOSIS — R55 Syncope and collapse: Secondary | ICD-10-CM | POA: Diagnosis present

## 2016-04-23 HISTORY — DX: Essential (primary) hypertension: I10

## 2016-04-23 HISTORY — DX: Unspecified convulsions: R56.9

## 2016-04-23 LAB — CBC WITH DIFFERENTIAL/PLATELET
BASOS PCT: 1 %
Basophils Absolute: 0.1 10*3/uL (ref 0.0–0.1)
EOS PCT: 4 %
Eosinophils Absolute: 0.3 10*3/uL (ref 0.0–0.7)
HCT: 27.1 % — ABNORMAL LOW (ref 36.0–46.0)
HEMOGLOBIN: 7.8 g/dL — AB (ref 12.0–15.0)
LYMPHS PCT: 30 %
Lymphs Abs: 2.1 10*3/uL (ref 0.7–4.0)
MCH: 18.3 pg — AB (ref 26.0–34.0)
MCHC: 28.8 g/dL — ABNORMAL LOW (ref 30.0–36.0)
MCV: 63.6 fL — AB (ref 78.0–100.0)
Monocytes Absolute: 0.5 10*3/uL (ref 0.1–1.0)
Monocytes Relative: 7 %
NEUTROS PCT: 58 %
Neutro Abs: 3.9 10*3/uL (ref 1.7–7.7)
Platelets: 389 10*3/uL (ref 150–400)
RBC: 4.26 MIL/uL (ref 3.87–5.11)
RDW: 17.4 % — ABNORMAL HIGH (ref 11.5–15.5)
WBC: 6.9 10*3/uL (ref 4.0–10.5)

## 2016-04-23 LAB — URINALYSIS, ROUTINE W REFLEX MICROSCOPIC
BILIRUBIN URINE: NEGATIVE
Glucose, UA: NEGATIVE mg/dL
Ketones, ur: NEGATIVE mg/dL
NITRITE: NEGATIVE
Protein, ur: 30 mg/dL — AB
SPECIFIC GRAVITY, URINE: 1.008 (ref 1.005–1.030)
pH: 6 (ref 5.0–8.0)

## 2016-04-23 LAB — TROPONIN I
Troponin I: <0.03 ng/mL (ref <0.03)
Troponin I: <0.03 ng/mL (ref <0.03)

## 2016-04-23 LAB — URINE MICROSCOPIC-ADD ON

## 2016-04-23 LAB — IRON AND TIBC
IRON: 13 ug/dL — AB (ref 28–170)
SATURATION RATIOS: 3 % — AB (ref 10.4–31.8)
TIBC: 468 ug/dL — AB (ref 250–450)
UIBC: 455 ug/dL

## 2016-04-23 LAB — GLUCOSE HEMOCUE WAIVED: Glu Hemocue Waived: 99 mg/dL (ref 65–99)

## 2016-04-23 LAB — BASIC METABOLIC PANEL
Anion gap: 8 (ref 5–15)
BUN: 11 mg/dL (ref 6–20)
CHLORIDE: 107 mmol/L (ref 101–111)
CO2: 23 mmol/L (ref 22–32)
Calcium: 8.9 mg/dL (ref 8.9–10.3)
Creatinine, Ser: 0.87 mg/dL (ref 0.44–1.00)
GFR calc Af Amer: >60 mL/min (ref >60)
GFR calc non Af Amer: >60 mL/min (ref >60)
GLUCOSE: 99 mg/dL (ref 65–99)
POTASSIUM: 3.8 mmol/L (ref 3.5–5.1)
Sodium: 138 mmol/L (ref 135–145)

## 2016-04-23 LAB — TYPE AND SCREEN
ABO/RH(D): A NEG
Antibody Screen: NEGATIVE

## 2016-04-23 LAB — I-STAT TROPONIN, ED: Troponin i, poc: 0 ng/mL (ref 0.00–0.08)

## 2016-04-23 LAB — HEPATIC FUNCTION PANEL
ALT: 27 U/L (ref 14–54)
AST: 25 U/L (ref 15–41)
Albumin: 3.7 g/dL (ref 3.5–5.0)
Alkaline Phosphatase: 60 U/L (ref 38–126)
BILIRUBIN TOTAL: 0.4 mg/dL (ref 0.3–1.2)
Total Protein: 7 g/dL (ref 6.5–8.1)

## 2016-04-23 LAB — CBC
HCT: 26.2 % — ABNORMAL LOW (ref 36.0–46.0)
Hemoglobin: 7.6 g/dL — ABNORMAL LOW (ref 12.0–15.0)
MCH: 18.1 pg — AB (ref 26.0–34.0)
MCHC: 29 g/dL — ABNORMAL LOW (ref 30.0–36.0)
MCV: 62.4 fL — AB (ref 78.0–100.0)
PLATELETS: 402 10*3/uL — AB (ref 150–400)
RBC: 4.2 MIL/uL (ref 3.87–5.11)
RDW: 16.9 % — AB (ref 11.5–15.5)
WBC: 6.8 10*3/uL (ref 4.0–10.5)

## 2016-04-23 LAB — I-STAT CHEM 8, ED
BUN: 12 mg/dL (ref 6–20)
CREATININE: 0.9 mg/dL (ref 0.44–1.00)
Calcium, Ion: 1.11 mmol/L — ABNORMAL LOW (ref 1.15–1.40)
Chloride: 105 mmol/L (ref 101–111)
Glucose, Bld: 97 mg/dL (ref 65–99)
HEMATOCRIT: 28 % — AB (ref 36.0–46.0)
HEMOGLOBIN: 9.5 g/dL — AB (ref 12.0–15.0)
POTASSIUM: 3.7 mmol/L (ref 3.5–5.1)
Sodium: 138 mmol/L (ref 135–145)
TCO2: 23 mmol/L (ref 0–100)

## 2016-04-23 LAB — TSH: TSH: 0.877 u[IU]/mL (ref 0.350–4.500)

## 2016-04-23 LAB — MAGNESIUM: Magnesium: 2 mg/dL (ref 1.7–2.4)

## 2016-04-23 LAB — RETICULOCYTES
RBC.: 4.25 MIL/uL (ref 3.87–5.11)
RETIC COUNT ABSOLUTE: 38.3 10*3/uL (ref 19.0–186.0)
Retic Ct Pct: 0.9 % (ref 0.4–3.1)

## 2016-04-23 LAB — POC OCCULT BLOOD, ED: FECAL OCCULT BLD: NEGATIVE

## 2016-04-23 LAB — PROTIME-INR
INR: 1.04
Prothrombin Time: 13.6 seconds (ref 11.4–15.2)

## 2016-04-23 LAB — VITAMIN B12: Vitamin B-12: 1501 pg/mL — ABNORMAL HIGH (ref 180–914)

## 2016-04-23 LAB — FERRITIN: Ferritin: 3 ng/mL — ABNORMAL LOW (ref 11–307)

## 2016-04-23 LAB — ABO/RH: ABO/RH(D): A NEG

## 2016-04-23 LAB — FOLATE: Folate: 17.2 ng/mL (ref >5.9)

## 2016-04-23 MED ORDER — ONDANSETRON HCL 4 MG/2ML IJ SOLN
4.0000 mg | Freq: Four times a day (QID) | INTRAMUSCULAR | Status: DC | PRN
  Administered 2016-04-23 – 2016-04-24 (×2): 4 mg via INTRAVENOUS
  Filled 2016-04-23 (×2): qty 2

## 2016-04-23 MED ORDER — ACETAMINOPHEN 325 MG PO TABS
650.0000 mg | ORAL_TABLET | Freq: Four times a day (QID) | ORAL | Status: DC | PRN
  Administered 2016-04-23 – 2016-04-24 (×2): 650 mg via ORAL
  Filled 2016-04-23 (×2): qty 2

## 2016-04-23 MED ORDER — LEVALBUTEROL HCL 0.63 MG/3ML IN NEBU: 0.6300 mg | INHALATION_SOLUTION | Freq: Four times a day (QID) | RESPIRATORY_TRACT | Status: DC | PRN

## 2016-04-23 MED ORDER — ONDANSETRON HCL 4 MG PO TABS: 4.0000 mg | ORAL_TABLET | Freq: Four times a day (QID) | ORAL | Status: DC | PRN

## 2016-04-23 MED ORDER — SODIUM CHLORIDE 0.9 % IV SOLN
INTRAVENOUS | Status: DC
  Administered 2016-04-23: 20:00:00 via INTRAVENOUS

## 2016-04-23 MED ORDER — ACETAMINOPHEN 325 MG PO TABS
650.0000 mg | ORAL_TABLET | Freq: Once | ORAL | Status: AC
  Administered 2016-04-23: 650 mg via ORAL
  Filled 2016-04-23: qty 2

## 2016-04-23 MED ORDER — LEVALBUTEROL HCL 0.63 MG/3ML IN NEBU
0.6300 mg | INHALATION_SOLUTION | Freq: Four times a day (QID) | RESPIRATORY_TRACT | Status: DC
  Administered 2016-04-23: 0.63 mg via RESPIRATORY_TRACT
  Filled 2016-04-23: qty 3

## 2016-04-23 MED ORDER — SODIUM CHLORIDE 0.9% FLUSH
3.0000 mL | Freq: Two times a day (BID) | INTRAVENOUS | Status: DC
  Administered 2016-04-23 – 2016-04-24 (×2): 3 mL via INTRAVENOUS

## 2016-04-23 MED ORDER — ACETAMINOPHEN 650 MG RE SUPP: 650.0000 mg | Freq: Four times a day (QID) | RECTAL | Status: DC | PRN
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
## 2016-04-23 NOTE — Progress Notes (Signed)
Patient is crying complaining of back and leg pain 10/10. Spoke with Dr. Laren Everts who states he is coming over to see the patient.

## 2016-04-23 NOTE — Addendum Note (Signed)
Addended by: Wardell Heath on: 04/23/2016 03:53 PM   Modules accepted: Orders
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
## 2016-04-23 NOTE — ED Notes (Signed)
Pt returned from CT. Visitor reported patient "had a seizure." Stated patient was twitching and had fixed guys upward. Pt was on the monitor at the time. No change in VS noted. When this RN assessed the patient, pt was able recall past medical history, but was unable to state her name or age.

## 2016-04-23 NOTE — ED Notes (Signed)
Pt found eating [employer] and Dr. Malachi Bonds brought in by family member. Pt made aware that she was to be NPO at this time per MD order.

## 2016-04-23 NOTE — ED Notes (Signed)
Patient taken to MRI
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
## 2016-04-23 NOTE — ED Notes (Signed)
Report called to 5W 

## 2016-04-23 NOTE — ED Notes (Signed)
MD Mesner at the bedside speaking with patient about admission

## 2016-04-23 NOTE — ED Triage Notes (Signed)
Pt arrives from PCP via [facility] c/o fall, LOC, seizure-like activity.  EMS reports pt had fall with injury to head, AOx0 on their arrival, followed by LOC and seizure-like activity.  EMS reports giving 2 mg ativan and 4 mg Zofran.  Pt oriented to self on arrival. Dr. Dayna Barker at bedside.

## 2016-04-23 NOTE — Progress Notes (Signed)
Pt. Assessed per RT protocol.

## 2016-04-23 NOTE — ED Notes (Signed)
Neurology at the bedside
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
## 2016-04-23 NOTE — ED Notes (Signed)
Xray at the bedside.
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
## 2016-04-24 ENCOUNTER — Observation Stay (HOSPITAL_COMMUNITY): Payer: Worker's Compensation

## 2016-04-24 ENCOUNTER — Ambulatory Visit (HOSPITAL_COMMUNITY): Payer: Self-pay

## 2016-04-24 ENCOUNTER — Encounter: Payer: Self-pay | Admitting: Family

## 2016-04-24 DIAGNOSIS — F0781 Postconcussional syndrome: Secondary | ICD-10-CM | POA: Diagnosis not present

## 2016-04-24 DIAGNOSIS — S060X9A Concussion with loss of consciousness of unspecified duration, initial encounter: Secondary | ICD-10-CM | POA: Diagnosis not present

## 2016-04-24 LAB — PREGNANCY, URINE: Preg Test, Ur: NEGATIVE

## 2016-04-24 LAB — TROPONIN I

## 2016-04-24 MED ORDER — ZOLPIDEM TARTRATE 5 MG PO TABS
5.0000 mg | ORAL_TABLET | Freq: Once | ORAL | Status: AC
Start: 2016-04-24 — End: 2016-04-24
  Administered 2016-04-24: 5 mg via ORAL
  Filled 2016-04-24: qty 1

## 2016-04-24 NOTE — Progress Notes (Signed)
Patient and spouse educated on discharge instructions both verbalized understanding, CCMD notified, IV removed and note for work given to patient.  Betha Loa Torian Thoennes, RN

## 2016-04-24 NOTE — Progress Notes (Signed)
Neurology Progress Note  Subjective: The patient is currently complaining of headache. This is worse on the right side without any associated nausea, photophobia, phonophobia. She has not had any more episodes of unresponsiveness. She is presently denying any other concerns. She is hopeful that she will be able to go home soon.  Current Meds:   Current Facility-Administered Medications:  .  0.9 %  sodium chloride infusion, , Intravenous, Continuous, Reyne Dumas, MD, Last Rate: 100 mL/hr at 04/23/16 2007 .  acetaminophen (TYLENOL) tablet 650 mg, 650 mg, Oral, Q6H PRN, 650 mg at 04/23/16 2039 **OR** acetaminophen (TYLENOL) suppository 650 mg, 650 mg, Rectal, Q6H PRN, Reyne Dumas, MD .  levalbuterol (XOPENEX) nebulizer solution 0.63 mg, 0.63 mg, Nebulization, Q6H PRN, Reyne Dumas, MD .  ondansetron (ZOFRAN) tablet 4 mg, 4 mg, Oral, Q6H PRN **OR** ondansetron (ZOFRAN) injection 4 mg, 4 mg, Intravenous, Q6H PRN, Reyne Dumas, MD, 4 mg at 04/24/16 0754 .  sodium chloride flush (NS) 0.9 % injection 3 mL, 3 mL, Intravenous, Q12H, Reyne Dumas, MD, 3 mL at 04/23/16 2008  Objective:  Temp:  [98 F (36.7 C)-98.3 F (36.8 C)] 98 F (36.7 C) (10/27 0511) Pulse Rate:  [67-88] 73 (10/27 0511) Resp:  [12-27] 18 (10/27 0511) BP: (111-141)/(52-99) 118/52 (10/27 0511) SpO2:  [98 %-100 %] 100 % (10/27 0511) Weight:  [75.1 kg (165 lb 8 oz)] 75.1 kg (165 lb 8 oz) (10/26 1958)  General: WDWN sitting up in bed in NAD. Alert, oriented x4. Speech is clear without dysarthria. Affect is flat and restricted.  HEENT: Neck is supple without lymphadenopathy. Mucous membranes are moist and the oropharynx is clear. Sclerae are anicteric. There is no conjunctival injection.  CV: Regular, no murmur. Carotid pulses are 2+ and symmetric with no bruits. Distal pulses 2+ and symmetric.  Lungs: CTAB  Extremities: No C/C/E. Neuro: MS: As noted above. No aphasia.  CN: Pupils are equal and reactive from 3-->2 mm bilaterally.  EOMI, no nystagmus. Facial sensation is intact to light touch. Face is symmetric at rest with normal strength and mobility with variable effort. Hearing is intact to conversational voice. Voice is normal in tone and quality. Palate elevates symmetrically. Uvula is midline. Bilateral SCM and trapezii are 5/5. Tongue is midline with normal bulk and mobility.  Motor: Normal bulk, tone, and strength throughout. She has diffuse giveway. No pronator drift. No tremor or other abnormal movements are observed.  Sensation: Intact to light touch.  DTRs: 2+, symmetric. Toes are downgoing bilaterally. No pathological reflexes.  Coordination: Finger-to-nose are without dysmetria bilaterally.    Labs: Lab Results  Component Value Date   WBC 6.8 04/23/2016   HGB 7.6 (L) 04/23/2016   HCT 26.2 (L) 04/23/2016   PLT 402 (H) 04/23/2016   GLUCOSE 97 04/23/2016   ALT 27 04/23/2016   AST 25 04/23/2016   NA 138 04/23/2016   K 3.7 04/23/2016   CL 105 04/23/2016   CREATININE 0.90 04/23/2016   BUN 12 04/23/2016   CO2 23 04/23/2016   TSH 0.877 04/23/2016   INR 1.04 04/23/2016   CBC Latest Ref Rng & Units 04/23/2016 04/23/2016 04/23/2016  WBC 4.0 - 10.5 K/uL 6.8 - 6.9  Hemoglobin 12.0 - 15.0 g/dL 7.6(L) 9.5(L) 7.8(L)  Hematocrit 36.0 - 46.0 % 26.2(L) 28.0(L) 27.1(L)  Platelets 150 - 400 K/uL 402(H) - 389    No results found for: HGBA1C Lab Results  Component Value Date   ALT 27 04/23/2016   AST 25 04/23/2016  ALKPHOS 60 04/23/2016   BILITOT 0.4 04/23/2016    Radiology:  I have personally and independently reviewed MRI scan of the brain without contrast from 04/23/16. This is mildly degraded by motion artifact but appears normal.  A/P:   1. Possible concussion: She may have suffered a mild concussion from her fall yesterday. She has no focal neurologic deficits. MRI of the brain and CT of the head have both been normal. Treatment is supportive. Her main symptom at this time his headache. She was  advised to limit activity as tolerated. She was advised to gradually increase activity, monitoring symptoms as she does. If she notices any increase in headache then she should decrease activity level and rest further.  2. Headache: She has a nonspecific generalized headache in the setting of possible concussion. Recommend symptomatically management with nonsteroidal anti-inflammatories and Tylenol. Opiates should be avoided. She was counseled about the possibility of drug overuse headache. Medication should be reserved for severe headache and she is encouraged to tolerate lesser headaches if they are not impairing her function.  3. Spells: She had episodes of unresponsiveness yesterday. On reviewing her medical record, she has had similar episodes in the past. She was seen by Dr. Ellouise Newer at [facility] on 08/30/14. This was a referral for loss of consciousness, numbness, headaches. At that time, she had reported episodes where she became confused and then became unresponsive with "her head flopping from side to side and back and forth because she couldn't keep it up" according to witnesses. EEG was performed at the time and was normal. Recommendation was for long-term EEG monitoring for further characterization of these spells but this was never performed. The patient states that she never followed up with the neurologist because she does not like to go to doctors. Based upon available information, my suspicion for epileptic seizure is low. I would recommend that she follow-up with Dr. Delice Lesch for further testing as necessary. There is no role for long-term EEG monitoring during this admission and this can be completed in the outpatient setting as previously recommended.  This was discussed with the patient and she is in agreement with the plan as stated. She was given the opportunity to ask any questions which were addressed to her satisfaction.  I have no additional recommendations at this time  and will sign off. Please call if there are any new questions or concerns that should arise. Recommend follow-up with Dr. Delice Lesch, her outpatient neurologist, after discharge.  Melba Coon, MD [facility]
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
## 2016-04-29 ENCOUNTER — Ambulatory Visit (INDEPENDENT_AMBULATORY_CARE_PROVIDER_SITE_OTHER): Payer: Worker's Compensation | Admitting: Pediatrics

## 2016-04-29 ENCOUNTER — Encounter: Payer: Self-pay | Admitting: Pediatrics

## 2016-04-29 ENCOUNTER — Encounter: Payer: Self-pay | Admitting: Family Medicine

## 2016-04-29 VITALS — BP 170/90 | HR 90 | Temp 98.3°F | Ht 60.0 in | Wt 169.0 lb

## 2016-04-29 DIAGNOSIS — M545 Low back pain: Secondary | ICD-10-CM

## 2016-04-29 DIAGNOSIS — M5441 Lumbago with sciatica, right side: Secondary | ICD-10-CM | POA: Diagnosis not present

## 2016-04-29 DIAGNOSIS — S060X9D Concussion with loss of consciousness of unspecified duration, subsequent encounter: Secondary | ICD-10-CM

## 2016-04-29 DIAGNOSIS — M5442 Lumbago with sciatica, left side: Secondary | ICD-10-CM | POA: Diagnosis not present
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
## 2016-04-29 NOTE — Patient Instructions (Addendum)

## 2016-04-30 ENCOUNTER — Telehealth: Payer: Self-pay | Admitting: Pediatrics

## 2016-04-30 ENCOUNTER — Other Ambulatory Visit: Payer: Worker's Compensation | Admitting: Pediatrics

## 2016-04-30 DIAGNOSIS — S060X1D Concussion with loss of consciousness of 30 minutes or less, subsequent encounter: Secondary | ICD-10-CM

## 2016-04-30 MED ORDER — ONDANSETRON 4 MG PO TBDP: 4.0000 mg | ORAL_TABLET | Freq: Two times a day (BID) | ORAL | 0 refills | Status: DC | PRN

## 2016-04-30 MED ORDER — CYCLOBENZAPRINE HCL 5 MG PO TABS: 5.0000 mg | ORAL_TABLET | Freq: Two times a day (BID) | ORAL | 0 refills | Status: DC | PRN

## 2016-04-30 NOTE — Telephone Encounter (Signed)
Sent in flexeril can take twice a day as needed and ondansetron, can take twice a day as needed

## 2016-04-30 NOTE — Telephone Encounter (Signed)
Patient aware that medication has been sent to pharmacy.  Patient states that she can not get into neurology until January 2018

## 2016-04-30 NOTE — Addendum Note (Signed)
Addended by: Eustaquio Maize on: 04/30/2016 02:09 PM   Modules accepted: Orders

## 2016-04-30 NOTE — Telephone Encounter (Signed)
Patient aware.

## 2016-05-06 NOTE — Telephone Encounter (Signed)
Pt with ongoing back pain and leg pain. Referral to ortho placed.

## 2016-05-07 ENCOUNTER — Ambulatory Visit (INDEPENDENT_AMBULATORY_CARE_PROVIDER_SITE_OTHER): Payer: Worker's Compensation | Admitting: Pediatrics

## 2016-05-07 ENCOUNTER — Encounter: Payer: Self-pay | Admitting: Pediatrics

## 2016-05-07 VITALS — BP 141/84 | HR 57 | Temp 98.2°F | Ht 60.0 in | Wt 169.0 lb

## 2016-05-07 DIAGNOSIS — R300 Dysuria: Secondary | ICD-10-CM | POA: Diagnosis not present

## 2016-05-07 DIAGNOSIS — M5442 Lumbago with sciatica, left side: Secondary | ICD-10-CM

## 2016-05-07 DIAGNOSIS — R200 Anesthesia of skin: Secondary | ICD-10-CM | POA: Diagnosis not present

## 2016-05-07 DIAGNOSIS — M5441 Lumbago with sciatica, right side: Secondary | ICD-10-CM

## 2016-05-07 LAB — URINALYSIS, COMPLETE
Bilirubin, UA: NEGATIVE
GLUCOSE, UA: NEGATIVE
KETONES UA: NEGATIVE
NITRITE UA: NEGATIVE
PROTEIN UA: NEGATIVE
SPEC GRAV UA: 1.01 (ref 1.005–1.030)
Urobilinogen, Ur: 0.2 mg/dL (ref 0.2–1.0)
pH, UA: 5 (ref 5.0–7.5)

## 2016-05-07 LAB — MICROSCOPIC EXAMINATION: WBC, UA: NONE SEEN /hpf (ref 0–?)
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
## 2016-05-13 ENCOUNTER — Telehealth: Payer: Self-pay | Admitting: *Deleted

## 2016-05-13 NOTE — Telephone Encounter (Signed)
Incoming call from pt Pt states per husband, she had another episode last PM of blank stare that lasted around 3 minutes Some weakness and confusion afterward Has appt in the AM with Dr Evette Doffing Any recommendation

## 2016-05-14 ENCOUNTER — Encounter: Payer: Self-pay | Admitting: Pediatrics

## 2016-05-14 ENCOUNTER — Ambulatory Visit (INDEPENDENT_AMBULATORY_CARE_PROVIDER_SITE_OTHER): Payer: Worker's Compensation | Admitting: Pediatrics

## 2016-05-14 VITALS — BP 132/86 | HR 80 | Temp 98.2°F | Ht 60.0 in | Wt 169.6 lb

## 2016-05-14 DIAGNOSIS — F0781 Postconcussional syndrome: Secondary | ICD-10-CM

## 2016-05-14 DIAGNOSIS — R569 Unspecified convulsions: Secondary | ICD-10-CM

## 2016-05-14 DIAGNOSIS — M5441 Lumbago with sciatica, right side: Secondary | ICD-10-CM

## 2016-05-14 DIAGNOSIS — R200 Anesthesia of skin: Secondary | ICD-10-CM | POA: Diagnosis not present

## 2016-05-14 DIAGNOSIS — M5442 Lumbago with sciatica, left side: Secondary | ICD-10-CM | POA: Diagnosis not present

## 2016-05-14 DIAGNOSIS — R202 Paresthesia of skin: Secondary | ICD-10-CM

## 2016-05-14 NOTE — Progress Notes (Signed)
  Subjective:   Patient ID: Jenny Brown, female    DOB: 28-Mar-1971, 45 y.o.   MRN: [id number] CC: Workers Comp Follow up (Possible Seizure, numbness right side)  HPI: Jenny Brown is a 45 y.o. female presenting for Workers Comp Follow up (Possible Seizure, numbness right side)  Two days ago had episode witnessed by husband Lasted for about 5 minutes Was shaking during No loss of bladder/bowel function She says he told her she was confused, very weak for a while after episodes Especially R side of her body was numb and weak after episode Now mostly feet, both feet some numbness, worse R foot Still able to use R hand, often drops things Yesterday had a cup of coffee in R hand and dropped the coffee cup without meaning to  Tingling in legs coming and going still, L leg not as bad  Still with episodes of memory loss, no new episodes of confusion  Relevant past medical, surgical, family and social history reviewed. Allergies and medications reviewed and updated. History  Smoking Status  . Never Smoker  Smokeless Tobacco  . Never Used   ROS: Per HPI   Objective:    BP 132/86   Pulse 80   Temp 98.2 F (36.8 C) (Oral)   Ht 5' (1.524 m)   Wt 169 lb 9.6 oz (76.9 kg)   LMP  (LMP Unknown)   BMI 33.12 kg/m   Wt Readings from Last 3 Encounters:  05/14/16 169 lb 9.6 oz (76.9 kg)  05/07/16 169 lb (76.7 kg)  04/29/16 169 lb (76.7 kg)    Gen: NAD, alert, cooperative with exam, NCAT EYES: EOMI, no conjunctival injection, or no icterus CV: NRRR, normal S1/S2 Resp: CTABL, no wheezes, normal WOB Abd: +BS, soft, NTND. no guarding or organomegaly Ext: No edema, warm Neuro: Alert and oriented, hand grip 4/5 R hand, 5/5 L hand No sensation to touch or monofilament R foot, starts at R ankle MSK: normal muscle bulk  Assessment & Plan:  Jenny Brown was seen today for workers comp follow up.  Diagnoses and all orders for this visit:  Postconcussive syndrome Still with headaches, continue  rest  Numbness and tingling of right arm and leg Symptoms no worse, minimally if at all improved Has MRI scheduled for Dec 5th  Acute bilateral low back pain with bilateral sciatica Has appt with orthopedics Dec 5th  Seizure-like activity [facility]) Episode happened two days ago Lasted apprx 5 min No loss of bladder /bowel, was very tired following episode, witness by her husband, she says she doesn't remember what happened Thinks she will be able to follow up with her neurologist in the next couple of weeks, has talked with their office  Follow up plan: Return in about 3 weeks (around 06/03/2016).  Given ongoing neurologic symptoms, back pain, needs MRI and ortho evaluation prior to return to work Assunta Found, MD [place]

## 2016-05-17 ENCOUNTER — Encounter: Payer: Self-pay | Admitting: Pediatrics

## 2016-05-30 ENCOUNTER — Encounter: Payer: Self-pay | Admitting: Family Medicine

## 2016-06-03 ENCOUNTER — Ambulatory Visit (INDEPENDENT_AMBULATORY_CARE_PROVIDER_SITE_OTHER): Payer: Worker's Compensation | Admitting: Pediatrics

## 2016-06-03 ENCOUNTER — Other Ambulatory Visit: Payer: Self-pay | Admitting: Pediatrics

## 2016-06-03 VITALS — BP 130/91 | HR 69 | Temp 97.9°F | Ht 60.0 in | Wt 173.2 lb

## 2016-06-03 DIAGNOSIS — M5441 Lumbago with sciatica, right side: Secondary | ICD-10-CM

## 2016-06-03 DIAGNOSIS — S060X9D Concussion with loss of consciousness of unspecified duration, subsequent encounter: Secondary | ICD-10-CM

## 2016-06-03 DIAGNOSIS — R200 Anesthesia of skin: Secondary | ICD-10-CM

## 2016-06-03 DIAGNOSIS — F0781 Postconcussional syndrome: Secondary | ICD-10-CM

## 2016-06-03 DIAGNOSIS — R202 Paresthesia of skin: Secondary | ICD-10-CM

## 2016-06-03 DIAGNOSIS — M5442 Lumbago with sciatica, left side: Secondary | ICD-10-CM

## 2016-06-03 NOTE — Telephone Encounter (Signed)
Pt requesting refill of Zofran Please advise

## 2016-06-03 NOTE — Progress Notes (Signed)
  Subjective:   Patient ID: Jenny Brown, female    DOB: 25-Nov-1970, 45 y.o.   MRN: [id number] CC: Workers comp (Leg Numbness)  HPI: Jenny Brown is a 45 y.o. female presenting for Workers comp (Leg Numbness)  MRI showed degenerating discs per pt Still has weakness, tingling in legs Pain is ongoing in legs all the time, tingling now Pain is an aching pain, no matter what she does, sitting, walking someitmes walking helps  Sitting and laying worst  Tingling in R arm, no pain in arm Continues to have headaches, mostly R side Sometimes goes into the back of her neck Happens apprx every other day Some days headache more severe, takes ibuprofen throughout day Sometimes headache less strong Continues to feel really tired Dizziness   ROS: Per HPI   Objective:    BP (!) 130/91   Pulse 69   Temp 97.9 F (36.6 C) (Oral)   Ht 5' (1.524 m)   Wt 173 lb 3.2 oz (78.6 kg)   BMI 33.83 kg/m   Wt Readings from Last 3 Encounters:  06/03/16 173 lb 3.2 oz (78.6 kg)  05/14/16 169 lb 9.6 oz (76.9 kg)  05/07/16 169 lb (76.7 kg)    Gen: NAD, alert, cooperative with exam, NCAT EYES: EOMI, no conjunctival injection, or no icterus CV: distal pulses 2+ b/l Resp:  normal WOB Abd: +BS, soft, NTND. no guarding or organomegaly Ext: No edema, warm Neuro: Alert and oriented, 4/5 strength R ext/flex at knee, 5/5 L ext/flex knee. Variable effort with leg exam due to pain in R leg per pt. 3+ patellar reflex R leg, 2+ L leg. Decreased sensation to touch R lower ext to the knee and R upper ext to the top of shoulder/neck compared with L  Assessment & Plan:  Jenny Brown was seen today for workers comp.  Diagnoses and all orders for this visit: Acute bilateral low back pain with bilateral sciatica Numbness and tingling of right arm and leg No improvement in symptoms, continues to have b/l leg pain Seen by ortho Recommended MRI cervical spine Per pt will be scheduled next week No more than 10 lbs lifting,  pushing or pulling until cleared  Concussion with loss of consciousness, subsequent encounter Postconcussive syndrome Continues to have headaches, has appt with PMR for post-concussive syndrome  Follow up plan: 3 weeks Jenny Found, MD [place]

## 2016-06-04 NOTE — Telephone Encounter (Signed)
Pt notified of RX 

## 2016-06-08 ENCOUNTER — Encounter: Payer: Self-pay | Admitting: *Deleted

## 2016-06-16 ENCOUNTER — Encounter: Payer: Self-pay | Admitting: Physical Medicine & Rehabilitation

## 2016-06-23 ENCOUNTER — Encounter: Payer: Self-pay | Admitting: Pediatrics

## 2016-06-23 ENCOUNTER — Ambulatory Visit (INDEPENDENT_AMBULATORY_CARE_PROVIDER_SITE_OTHER): Payer: Worker's Compensation | Admitting: Pediatrics

## 2016-06-23 VITALS — BP 159/102 | HR 69 | Temp 97.8°F | Ht 61.52 in | Wt 176.8 lb

## 2016-06-23 DIAGNOSIS — M25561 Pain in right knee: Secondary | ICD-10-CM

## 2016-06-23 DIAGNOSIS — M25562 Pain in left knee: Secondary | ICD-10-CM | POA: Diagnosis not present

## 2016-06-23 DIAGNOSIS — I10 Essential (primary) hypertension: Secondary | ICD-10-CM

## 2016-06-23 MED ORDER — LISINOPRIL-HYDROCHLOROTHIAZIDE 20-25 MG PO TABS: 1.0000 | ORAL_TABLET | Freq: Every day | ORAL | 3 refills | Status: DC

## 2016-06-23 NOTE — Progress Notes (Signed)
  Subjective:   Patient ID: Jenny Brown, female    DOB: 19-Mar-1971, 45 y.o.   MRN: [id number] CC: workers comp (3 week re check)  HPI: Jenny Brown is a 45 y.o. female presenting for workers comp (3 week re check)  Legs are worse at night when she is trying to sleep or sitting flor long period of time Taking ibuprofen at night R always worse Tingles all the time from knees down b/l Sometimes aching, acheyness is worse at night When aching, hasnt found anything to make it better  Has appt with [facility] neurologist 1/15 Doesn't have f/u appt scheduled yet with ortho, had MRI done last week of cervical spine, has nt been called yet about results  Now back at work Working in office Following weight lifting restrictions given by orthopedics  ROS: Per HPI   Objective:    BP (!) 159/102   Pulse 69   Temp 97.8 F (36.6 C) (Oral)   Ht 5' 1.52" (1.563 m)   Wt 176 lb 12.8 oz (80.2 kg)   BMI 32.84 kg/m    Gen: NAD, alert, cooperative with exam, NCAT EYES: EOMI, no conjunctival injection, or no icterus CV: NRRR, normal S1/S2, no murmur, distal pulses 2+ b/l Resp: CTABL, no wheezes, normal WOB Abd: +BS, soft, NTND. no guarding or organomegaly Ext: No edema, warm Neuro: Alert and oriented, 2+ patellar reflexes b/l, decreased sensation to touch R lower leg comapred with L.  Assessment & Plan:  Jenny Brown was seen today for workers comp.  Diagnoses and all orders for this visit:  Arthralgia of both lower legs Numbness tingling R arm and leg Has follow up with ortho that will be scheduled soon Had MRI done late last week, hasnrt heard back results yet Has appt with neurology 1/15 for eval of ongoing neuro symptoms.   Follow up plan: 2 weeks Assunta Found, MD [place]

## 2016-06-23 NOTE — Progress Notes (Signed)
Seen today for workers comp visit. Also see pt for primary care.  With elevated blood pressure consistently at home and here. Will increase Lisinopril/HCTZ to 20mg /25mg  from 20/12.5mg  Continue to check at home.  No CP, no SOB.

## 2016-06-24 ENCOUNTER — Other Ambulatory Visit: Payer: Worker's Compensation | Admitting: Pediatrics

## 2016-06-30 ENCOUNTER — Telehealth: Payer: Self-pay | Admitting: Pediatrics

## 2016-07-01 NOTE — Telephone Encounter (Signed)
Left message for patient to call back with fax number

## 2016-07-01 NOTE — Telephone Encounter (Signed)
Can you fax letter to work, OK to return to work without restrictions?

## 2016-07-03 ENCOUNTER — Ambulatory Visit: Payer: Self-pay | Admitting: Neurology

## 2016-07-06 NOTE — Telephone Encounter (Signed)
Multiple attempts have been made to contact pt and letter is in chart if it is still needed. Will close encounter.

## 2016-07-14 ENCOUNTER — Telehealth: Payer: Self-pay | Admitting: Physical Medicine & Rehabilitation

## 2016-07-14 ENCOUNTER — Encounter: Payer: Self-pay | Admitting: Physical Medicine & Rehabilitation

## 2016-07-14 ENCOUNTER — Encounter
Payer: Worker's Compensation | Attending: Physical Medicine & Rehabilitation | Admitting: Physical Medicine & Rehabilitation

## 2016-07-14 VITALS — BP 144/88 | HR 70

## 2016-07-14 DIAGNOSIS — R200 Anesthesia of skin: Secondary | ICD-10-CM | POA: Diagnosis not present

## 2016-07-14 DIAGNOSIS — R202 Paresthesia of skin: Secondary | ICD-10-CM

## 2016-07-14 DIAGNOSIS — M5442 Lumbago with sciatica, left side: Secondary | ICD-10-CM | POA: Diagnosis not present

## 2016-07-14 DIAGNOSIS — R569 Unspecified convulsions: Secondary | ICD-10-CM | POA: Insufficient documentation

## 2016-07-14 DIAGNOSIS — R531 Weakness: Secondary | ICD-10-CM | POA: Insufficient documentation

## 2016-07-14 DIAGNOSIS — M5441 Lumbago with sciatica, right side: Secondary | ICD-10-CM | POA: Diagnosis not present

## 2016-07-14 DIAGNOSIS — I10 Essential (primary) hypertension: Secondary | ICD-10-CM | POA: Diagnosis not present

## 2016-07-14 DIAGNOSIS — H8110 Benign paroxysmal vertigo, unspecified ear: Secondary | ICD-10-CM | POA: Insufficient documentation

## 2016-07-14 DIAGNOSIS — G4733 Obstructive sleep apnea (adult) (pediatric): Secondary | ICD-10-CM | POA: Insufficient documentation

## 2016-07-14 DIAGNOSIS — F0781 Postconcussional syndrome: Secondary | ICD-10-CM | POA: Insufficient documentation

## 2016-07-14 DIAGNOSIS — Z8249 Family history of ischemic heart disease and other diseases of the circulatory system: Secondary | ICD-10-CM | POA: Diagnosis not present

## 2016-07-14 DIAGNOSIS — M79604 Pain in right leg: Secondary | ICD-10-CM | POA: Insufficient documentation

## 2016-07-14 DIAGNOSIS — G2581 Restless legs syndrome: Secondary | ICD-10-CM | POA: Diagnosis not present

## 2016-07-14 DIAGNOSIS — M545 Low back pain: Secondary | ICD-10-CM | POA: Diagnosis not present

## 2016-07-14 DIAGNOSIS — Z833 Family history of diabetes mellitus: Secondary | ICD-10-CM | POA: Insufficient documentation

## 2016-07-14 MED ORDER — CYCLOBENZAPRINE HCL 5 MG PO TABS: 5.0000 mg | ORAL_TABLET | Freq: Two times a day (BID) | ORAL | 1 refills | Status: DC | PRN

## 2016-07-14 NOTE — Patient Instructions (Signed)
1.  TARGET 8 HOURS OF SLEEP AT NIGHT---USE YOUR TRAZODONE OR FLEXERIL IF NEEDED TO HELP 2. USE IBUPROFEN FOR PAIN , 800MG  THREE X DAILY  3. TRY TO AVOID JUGGLING TOO MUCH AT ONCE. TRY TO FINISH ONE TASK BEFORE MOVING TO THE NEXT.  4. TAKE NOTES, USE ALARMS, TRY TO CREATE A ROUTINE---ALL WILL HELP WITH RETENTION  5. TAKE A REST BREAK IF NEEDED DURING THE DAY.   PLEASE FEEL FREE TO CALL OUR OFFICE WITH ANY PROBLEMS OR QUESTIONS [phone]

## 2016-07-14 NOTE — Telephone Encounter (Signed)
Patient called stating she needed a letter for work faxed over to [phone], just to say she had an appointment today.
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
## 2016-09-14 ENCOUNTER — Ambulatory Visit: Payer: Self-pay | Admitting: Physical Medicine & Rehabilitation

## 2016-09-15 ENCOUNTER — Encounter: Payer: Self-pay | Admitting: Physical Medicine & Rehabilitation

## 2016-09-15 ENCOUNTER — Encounter
Payer: Worker's Compensation | Attending: Physical Medicine & Rehabilitation | Admitting: Physical Medicine & Rehabilitation

## 2016-09-15 VITALS — BP 173/102 | HR 66

## 2016-09-15 DIAGNOSIS — Z9851 Tubal ligation status: Secondary | ICD-10-CM | POA: Insufficient documentation

## 2016-09-15 DIAGNOSIS — M545 Low back pain: Secondary | ICD-10-CM | POA: Insufficient documentation

## 2016-09-15 DIAGNOSIS — Z833 Family history of diabetes mellitus: Secondary | ICD-10-CM | POA: Insufficient documentation

## 2016-09-15 DIAGNOSIS — Z8249 Family history of ischemic heart disease and other diseases of the circulatory system: Secondary | ICD-10-CM | POA: Diagnosis not present

## 2016-09-15 DIAGNOSIS — R569 Unspecified convulsions: Secondary | ICD-10-CM | POA: Insufficient documentation

## 2016-09-15 DIAGNOSIS — H8110 Benign paroxysmal vertigo, unspecified ear: Secondary | ICD-10-CM | POA: Insufficient documentation

## 2016-09-15 DIAGNOSIS — G4733 Obstructive sleep apnea (adult) (pediatric): Secondary | ICD-10-CM | POA: Diagnosis not present

## 2016-09-15 DIAGNOSIS — R531 Weakness: Secondary | ICD-10-CM | POA: Insufficient documentation

## 2016-09-15 DIAGNOSIS — M79604 Pain in right leg: Secondary | ICD-10-CM | POA: Diagnosis not present

## 2016-09-15 DIAGNOSIS — F0781 Postconcussional syndrome: Secondary | ICD-10-CM

## 2016-09-15 DIAGNOSIS — G2581 Restless legs syndrome: Secondary | ICD-10-CM | POA: Diagnosis not present

## 2016-09-15 DIAGNOSIS — I10 Essential (primary) hypertension: Secondary | ICD-10-CM | POA: Diagnosis not present

## 2016-09-15 NOTE — Progress Notes (Signed)
Subjective:    Patient ID: Jenny Brown, female    DOB: December 05, 1970, 46 y.o.   MRN: [id number]  HPI   Jenny Brown is here in follow up of her post-concussion syndrome and BPPV. She tells me that [facility] never called her and that she never went for vestibular therapy. She has, however, seen natural improvement in her symptoms and no longer complains of vertigo.  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 Her back is improving also. She essentially feels that she's back to her baseline   Pain Inventory Average Pain 0 Pain Right Now 0 My pain is .  In the last 24 hours, has pain interfered with the following? General activity 0 Relation with others 0 Enjoyment of life 0 What TIME of day is your pain at its worst? . Sleep (in general) Good  Pain is worse with: . Pain improves with: . Relief from Meds: .  Mobility walk without assistance  Function employed # of hrs/week 40  Neuro/Psych No problems in this area  Prior Studies Any changes since last visit?  no  Physicians involved in your care Any changes since last visit?  no   Family History  Problem Relation Age of Onset  . Heart disease Mother   . Diabetes Mother    Social History   Social History  . Marital status: Married    Spouse name: N/A  . Number of children: N/A  . Years of education: N/A   Social History Main Topics  . Smoking status: Never Smoker  . Smokeless tobacco: Never Used  . Alcohol use No  . Drug use: No  . Sexual activity: Not on file   Other Topics Concern  . Not on file   Social History Narrative   ** Merged History Encounter **       Lives with husband and 2 children in a one story home.  Works as a Optometrist.    Past Surgical History:  Procedure Laterality Date  . TUBAL LIGATION     Past Medical History:  Diagnosis Date  . Essential hypertension, benign   . Hypertension   . OSA (obstructive sleep apnea)   . Restless legs syndrome   . Seizures  ([place])    There were no vitals taken for this visit.  Opioid Risk Score:   Fall Risk Score:  `1  Depression screen PHQ 2/9  Depression screen [facility] 2/9 07/14/2016 06/23/2016 06/03/2016 05/14/2016 05/07/2016 04/29/2016 01/10/2016  Decreased Interest 1 0 0 0 0 0 0  Down, Depressed, Hopeless 1 0 0 0 0 0 0  PHQ - 2 Score 2 0 0 0 0 0 0  Altered sleeping 2 - - - - - -  Tired, decreased energy 2 - - - - - -  Change in appetite 2 - - - - - -  Feeling bad or failure about yourself  1 - - - - - -  Trouble concentrating 2 - - - - - -  Moving slowly or fidgety/restless 2 - - - - - -  Suicidal thoughts 1 - - - - - -  PHQ-9 Score 14 - - - - - -  Difficult doing work/chores Somewhat difficult - - - - - -    Review of Systems  Constitutional: Negative.   HENT: Negative.   Eyes: Negative.   Respiratory: Negative.   Cardiovascular: Negative.   Gastrointestinal: Negative.   Endocrine: Negative.   Genitourinary: Negative.  Musculoskeletal: Negative.   Skin: Negative.   Allergic/Immunologic: Negative.   Neurological: Negative.   Hematological: Negative.   Psychiatric/Behavioral: Negative.   All other systems reviewed and are negative.      Objective:   Physical Exam  General: Alert and oriented x 3, No apparent distress HEENT: Head is normocephalic, atraumatic, PERRLA, EOMI, sclera anicteric, oral mucosa pink and moist, dentition intact, ext ear canals clear,  Neck: Supple without JVD or lymphadenopathy Heart: RRR Chest: CTA B Abdomen: Soft, non-tender, non-distended, bowel sounds positive. Extremities: No clubbing, cyanosis, or edema. Pulses are 2+ Skin: Clean and intact without signs of breakdown Neuro: CN intact. Normal cognitive exam. Motor/sensory normal Musculoskeletal: Full ROM, No pain with AROM or PROM in the neck, trunk, or extremities. Posture appropriate. Did not perform an in depth back exam. Psych: Pt's affect is appropriate. Pt is cooperative        Assessment &  Plan:  1. Postconcussion Syndrome after fall on 04/24/16: continues to experience the symptoms below: -BPPV: showing improvement. Still provoked by movement superiorly and to right.              -gave home vestibular exercises for gaze stabilization  -pacing as required for work/exercise  -no further recommendations made  -I wouldn't expect further problems moving forward. If she does, she was advised to call me. 2. Low back and right leg pain/right arm and leg weakness             -resolved  Follow up with me PRN. She is released to work, full-time. Reviewed plan with patient's case manger as well.

## 2016-09-15 NOTE — Patient Instructions (Addendum)
PLEASE FEEL FREE TO CALL OUR OFFICE WITH ANY PROBLEMS OR QUESTIONS ([phone])    PACE YOURSELF AND TAKE REST BREAKS AS YOU NEED TO IF YOU FEEL FATIGUED OR LIGHT-HEADED.   TARGET 8 HOURS PER NIGHT.     YOU ARE RELEASED TO WORK FULL-TIME AS TOLERATED.

## 2016-11-10 ENCOUNTER — Ambulatory Visit: Payer: Worker's Compensation | Admitting: Family

## 2016-12-07 ENCOUNTER — Ambulatory Visit (INDEPENDENT_AMBULATORY_CARE_PROVIDER_SITE_OTHER): Payer: BC Managed Care – PPO | Admitting: Pediatrics

## 2016-12-07 ENCOUNTER — Encounter: Payer: Self-pay | Admitting: Pediatrics

## 2016-12-07 VITALS — BP 118/83 | HR 71 | Temp 97.6°F | Ht 61.5 in | Wt 172.2 lb

## 2016-12-07 DIAGNOSIS — R519 Headache, unspecified: Secondary | ICD-10-CM

## 2016-12-07 DIAGNOSIS — R51 Headache: Secondary | ICD-10-CM

## 2016-12-07 DIAGNOSIS — I10 Essential (primary) hypertension: Secondary | ICD-10-CM

## 2016-12-07 DIAGNOSIS — R040 Epistaxis: Secondary | ICD-10-CM

## 2016-12-07 DIAGNOSIS — R0989 Other specified symptoms and signs involving the circulatory and respiratory systems: Secondary | ICD-10-CM | POA: Diagnosis not present

## 2016-12-07 NOTE — Patient Instructions (Addendum)
First morning void in toilet All other voids for the day into urine collection including first void the next morning.  Check BPs twice a day, including anytime you have a headache  Stop flonase

## 2016-12-07 NOTE — Progress Notes (Signed)
  Subjective:   Patient ID: Jenny Brown, female    DOB: [birth year], 46 y.o.   MRN: [id number] CC: Epistaxis (2-3 days a week last 3-4 weeks) and Migraine (increased more recently as well)  HPI: [name] is a 46 y.o. female presenting for Epistaxis (2-3 days a week last 3-4 weeks) and Migraine (increased more recently as well)  Started in the last month Having 2-3 times a day nose bleeds several times a week Holds pressure goes away, lasts 5- 10 min Always R nare that bleeds Never had nose bleeds like this before Started using flonase a couple of months ago for allergies, not using every day Also having some headaches Headaches and nose bleeds go together often Checks BP at home, is often over 389 systolic when she has headache Also will get blurry vision At other times her blood pressure is normal. 120s/70s-80s Taking BP med daily Denies cigarette, drug use  Relevant past medical, surgical, family and social history reviewed. Allergies and medications reviewed and updated. History  Smoking Status  . Never Smoker  Smokeless Tobacco  . Never Used   ROS: Per HPI   Objective:    BP 118/83   Pulse 71   Temp 97.6 F (36.4 C) (Oral)   Ht 5' 1.5" (1.562 m)   Wt 172 lb 3.2 oz (78.1 kg)   LMP 12/04/2016   BMI 32.01 kg/m   Wt Readings from Last 3 Encounters:  12/07/16 172 lb 3.2 oz (78.1 kg)  06/23/16 176 lb 12.8 oz (80.2 kg)  06/03/16 173 lb 3.2 oz (78.6 kg)    Gen: NAD, alert, cooperative with exam, NCAT EYES: EOMI, no conjunctival injection, or no icterus ENT:  TMs pearly gray b/l, OP without erythema, R nare with septum small excoriation CV: NRRR, normal S1/S2, no murmur, distal pulses 2+ b/l Resp: CTABL, no wheezes, normal WOB Abd: soft, NTND Ext: No edema, warm Neuro: Alert and oriented, strength equal b/l UE and LE, coordination grossly normal MSK: normal muscle bulk  Assessment & Plan:  Bethene was seen today for epistaxis and migraine.  Diagnoses and all  orders for this visit:  Labile blood pressure Goes from normal 110s-120s to 200s SBP with nose bleed, HA Comes out of the blue, no precipitating events Check below      Metanephrines, urine, 24 hour; Future -     Catecholamines, fractionated, urine, 24 hour; Future -     Basic Metabolic Panel  Epistaxis vasoline to inside of nares Hold pressure when starts Stop flonase If cont will refer to ENT for cautery eval -     CBC with Differential  Nonintractable headache, unspecified chronicity pattern, unspecified headache type Likely related to BPs, check BP regularly at home, eval as above Bring BPs no next clinic visit  Essential hypertension Cont lisinopril/HCTZ  Follow up plan: Return in about 2 weeks (around 12/21/2016). Assunta Found, MD [place]

## 2016-12-08 LAB — BASIC METABOLIC PANEL
BUN/Creatinine Ratio: 13 (ref 9–23)
BUN: 10 mg/dL (ref 6–24)
CALCIUM: 8.7 mg/dL (ref 8.7–10.2)
CO2: 24 mmol/L (ref 20–29)
CREATININE: 0.75 mg/dL (ref 0.57–1.00)
Chloride: 101 mmol/L (ref 96–106)
GFR calc Af Amer: 112 mL/min/{1.73_m2} (ref >59)
GFR calc non Af Amer: 97 mL/min/{1.73_m2} (ref >59)
GLUCOSE: 82 mg/dL (ref 65–99)
Potassium: 4.1 mmol/L (ref 3.5–5.2)
Sodium: 137 mmol/L (ref 134–144)

## 2016-12-08 LAB — CBC WITH DIFFERENTIAL/PLATELET
Basophils Absolute: 0 10*3/uL (ref 0.0–0.2)
Basos: 0 %
EOS (ABSOLUTE): 0.3 10*3/uL (ref 0.0–0.4)
EOS: 4 %
HEMATOCRIT: 29.7 % — AB (ref 34.0–46.6)
Hemoglobin: 8.8 g/dL — CL (ref 11.1–15.9)
IMMATURE GRANULOCYTES: 0 %
Immature Grans (Abs): 0 10*3/uL (ref 0.0–0.1)
LYMPHS: 25 %
Lymphocytes Absolute: 1.9 10*3/uL (ref 0.7–3.1)
MCH: 19.1 pg — ABNORMAL LOW (ref 26.6–33.0)
MCHC: 29.6 g/dL — ABNORMAL LOW (ref 31.5–35.7)
MCV: 65 fL — ABNORMAL LOW (ref 79–97)
MONOCYTES: 5 %
MONOS ABS: 0.4 10*3/uL (ref 0.1–0.9)
Neutrophils Absolute: 5.1 10*3/uL (ref 1.4–7.0)
Neutrophils: 66 %
Platelets: 342 10*3/uL (ref 150–379)
RBC: 4.6 x10E6/uL (ref 3.77–5.28)
RDW: 20.4 % — AB (ref 12.3–15.4)
WBC: 7.8 10*3/uL (ref 3.4–10.8)

## 2016-12-14 ENCOUNTER — Telehealth: Payer: Self-pay | Admitting: *Deleted

## 2016-12-14 ENCOUNTER — Telehealth: Payer: Self-pay | Admitting: Pediatrics

## 2016-12-14 DIAGNOSIS — K921 Melena: Secondary | ICD-10-CM

## 2016-12-14 NOTE — Telephone Encounter (Signed)
Hg low H/o heavy periods following with gyn but also says she has been having blood in stools Will refer to GI

## 2016-12-14 NOTE — Telephone Encounter (Signed)
Called to inform patient that hgb is lower than it has been in the past.  Informed patient that Dr. Evette Doffing would like for her to see gynecology sooner than august to see why she is having heavy periods.  Also informed patient that Dr. Evette Doffing would like for patient to start taking ferrous sulfate daily to see if hgb will come up any and if this does not work then patient will need to have a colonoscopy.  Patient states that she has been passing blood in stools with every BM and has had trouble with constipation and diarrhea.  Dr. Evette Doffing made aware and referral to GI placed for colonoscopy.  Will call patient when appt has been scheduled.

## 2016-12-15 ENCOUNTER — Encounter: Payer: Self-pay | Admitting: Gastroenterology

## 2016-12-21 ENCOUNTER — Other Ambulatory Visit: Payer: BC Managed Care – PPO

## 2016-12-21 DIAGNOSIS — R0989 Other specified symptoms and signs involving the circulatory and respiratory systems: Secondary | ICD-10-CM

## 2016-12-23 ENCOUNTER — Ambulatory Visit: Payer: BC Managed Care – PPO | Admitting: Pediatrics

## 2017-01-01 ENCOUNTER — Encounter: Payer: Self-pay | Admitting: Pediatrics

## 2017-01-11 LAB — METANEPHRINES, URINE, 24 HOUR
METANEPHRINES 24H UR: 36 ug/(24.h) — AB (ref 45–290)
Metaneph Total, Ur: 72 ug/L
NORMETANEPHRINE 24H UR: 162 ug/(24.h) (ref 82–500)
Normetanephrine, Ur: 323 ug/L

## 2017-01-11 LAB — CATECHOLAMINES, FRACTIONATED, URINE, 24 HOUR
Dopamine , 24H Ur: 203 ug/24 hr (ref 0–510)
Dopamine, Rand Ur: 145 ug/L
Epinephrine, 24H Ur: 1 ug/24 hr (ref 0–20)
Epinephrine, Rand Ur: 1 ug/L
NOREPINEPHRINE 24H UR: 43 ug/(24.h) (ref 0–135)
Norepinephrine, Rand Ur: 31 ug/L

## 2017-01-13 ENCOUNTER — Telehealth: Payer: Self-pay | Admitting: Pediatrics

## 2017-01-25 ENCOUNTER — Ambulatory Visit: Payer: Self-pay | Admitting: Gastroenterology

## 2017-03-03 ENCOUNTER — Encounter: Payer: Self-pay | Admitting: Pediatrics

## 2017-03-03 ENCOUNTER — Ambulatory Visit (INDEPENDENT_AMBULATORY_CARE_PROVIDER_SITE_OTHER): Payer: BC Managed Care – PPO | Admitting: Pediatrics

## 2017-03-03 VITALS — BP 126/86 | HR 78 | Temp 97.5°F | Ht 61.0 in | Wt 175.0 lb

## 2017-03-03 DIAGNOSIS — I10 Essential (primary) hypertension: Secondary | ICD-10-CM | POA: Diagnosis not present

## 2017-03-03 DIAGNOSIS — N921 Excessive and frequent menstruation with irregular cycle: Secondary | ICD-10-CM

## 2017-03-03 DIAGNOSIS — F338 Other recurrent depressive disorders: Secondary | ICD-10-CM | POA: Diagnosis not present

## 2017-03-03 DIAGNOSIS — D509 Iron deficiency anemia, unspecified: Secondary | ICD-10-CM | POA: Diagnosis not present

## 2017-03-03 MED ORDER — SERTRALINE HCL 50 MG PO TABS: 50.0000 mg | ORAL_TABLET | Freq: Every day | ORAL | 3 refills | Status: DC

## 2017-03-03 NOTE — Patient Instructions (Signed)
[email or web address]  Your provider wants you to schedule an appointment with a Psychologist/Psychiatrist. The following list of offices requires the patient to call and make their own appointment, as there is information they need that only you can provide. Please feel free to choose form the following providers:  [facility]   [phone] Crisis Recovery in [place] [phone]  [facility]  [phone]   [address] [place], [place]  (Scheduled through [facility]) Must call and do an interview for appointment. Sees Children / Accepts Medicaid  [facility]    [phone]  [address], [address]    [place], [place]       [facility]  [phone] [address] [place], [place]  Evaluates for Autism but does not treat it Sees Children / Accepts Medicaid  [facility]    [phone] [address], [address]   [place], [place] Medication management, substance abuse, bipolar, grief, family, marriage, OCD, anxiety, PTSD Sees children / Accepts Medicaid  [facility]    [phone] [address] [place], [place] Sees children / Accepts Medicaid  [facility]  [phone] [address] [place], [place]   Dr Akinlayo     [phone] [address] [place], [place]  Sees ADD & ADHD for treatment Accepts Medicaid  [facility]  [phone] [address] [place], [place] Evaluates for Autism Accepts Medicaid  [facility]  [phone] [address] [place], [place]  Does Adult ADD evaluations Does not accept Medicaid  [facility]   [phone] [address]   [place], [place] Uses animal therapy  Sees children as young as 3 years old Accepts Medicaid  [facility]     [phone]    [address]  [place], [place] [address] Sees children Accepts Medicaid   

## 2017-03-03 NOTE — Progress Notes (Signed)
  Subjective:   Patient ID: Jenny Brown, female    DOB: [birth year], 46 y.o.   MRN: [id number] CC: Dizziness; Blurred Vision; and Headache  HPI: Jenny Brown is a 46 y.o. 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 lunch multiple days a week bc of teachers schedule Told she cant use bathroom so some days she skips BP med, then has HA and blurry vision, feels like her BP is high  Has missed lunch mulitple days, gets lightheaded and fatigued those days  Continues to have menorrhagia, on PO iron Was recommended to have hysterectomy, practice wanted down payment on surgery, she is interested in other options  No blurry vision, HA today  Mood is down with above stress going on  Some passive thoughts about not wanting to be here, never done anything to hurt self in past, 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 Allergies and medications reviewed and updated. History  Smoking Status  . Never Smoker  Smokeless Tobacco  . Never Used   ROS: Per HPI   Objective:    BP 126/86   Pulse 78   Temp (!) 97.5 F (36.4 C) (Oral)   Ht 5\' 1"  (1.549 m)   Wt 175 lb (79.4 kg)   BMI 33.07 kg/m   Wt Readings from Last 3 Encounters:  03/03/17 175 lb (79.4 kg)  12/07/16 172 lb 3.2 oz (78.1 kg)  06/23/16 176 lb 12.8 oz (80.2 kg)    Gen: NAD, alert, cooperative with exam, NCAT EYES: EOMI, no conjunctival injection, or no icterus ENT:  OP without erythema LYMPH: no cervical LAD CV: NRRR, normal S1/S2, no murmur, distal pulses 2+ b/l Resp: CTABL, no wheezes, normal WOB Abd: +BS, soft, NTND.  Ext: No edema, warm Neuro: Alert and oriented, strength equal b/l UE and LE, coordination grossly normal MSK: normal muscle bulk Psych: tearful at times, full affect, +passive  SI  Assessment & Plan:  Alpha was seen today for med problem f/u.  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   Follow up plan: Return in about 5 weeks (around 04/07/2017). Assunta Found, MD [place]

## 2017-03-04 LAB — CBC WITH DIFFERENTIAL/PLATELET
Basophils Absolute: 0.1 10*3/uL (ref 0.0–0.2)
Basos: 1 %
EOS (ABSOLUTE): 0.4 10*3/uL (ref 0.0–0.4)
EOS: 6 %
HEMATOCRIT: 30.6 % — AB (ref 34.0–46.6)
Hemoglobin: 9.3 g/dL — ABNORMAL LOW (ref 11.1–15.9)
IMMATURE GRANS (ABS): 0 10*3/uL (ref 0.0–0.1)
Immature Granulocytes: 0 %
LYMPHS: 25 %
Lymphocytes Absolute: 1.8 10*3/uL (ref 0.7–3.1)
MCH: 19.6 pg — AB (ref 26.6–33.0)
MCHC: 30.4 g/dL — ABNORMAL LOW (ref 31.5–35.7)
MCV: 65 fL — AB (ref 79–97)
Monocytes Absolute: 0.6 10*3/uL (ref 0.1–0.9)
Monocytes: 9 %
NEUTROS ABS: 4.5 10*3/uL (ref 1.4–7.0)
Neutrophils: 59 %
PLATELETS: 409 10*3/uL — AB (ref 150–379)
RBC: 4.74 x10E6/uL (ref 3.77–5.28)
RDW: 18.9 % — ABNORMAL HIGH (ref 12.3–15.4)
WBC: 7.5 10*3/uL (ref 3.4–10.8)

## 2017-03-05 ENCOUNTER — Encounter: Payer: Self-pay | Admitting: *Deleted

## 2017-04-09 ENCOUNTER — Ambulatory Visit: Payer: BC Managed Care – PPO | Admitting: Pediatrics

## 2017-04-12 ENCOUNTER — Encounter: Payer: Self-pay | Admitting: Obstetrics and Gynecology

## 2017-04-12 ENCOUNTER — Ambulatory Visit (INDEPENDENT_AMBULATORY_CARE_PROVIDER_SITE_OTHER): Payer: BC Managed Care – PPO | Admitting: Obstetrics and Gynecology

## 2017-04-12 VITALS — BP 140/100 | HR 74 | Wt 179.0 lb

## 2017-04-12 DIAGNOSIS — N939 Abnormal uterine and vaginal bleeding, unspecified: Secondary | ICD-10-CM

## 2017-04-12 LAB — POCT HEMOGLOBIN: Hemoglobin: 8.8 g/dL — AB (ref 12.2–16.2)

## 2017-04-12 MED ORDER — MEGESTROL ACETATE 40 MG PO TABS: 40.0000 mg | ORAL_TABLET | Freq: Three times a day (TID) | ORAL | 2 refills | Status: DC

## 2017-04-12 NOTE — Progress Notes (Signed)
[facility] Visit  04/12/2017            Patient name: Jenny Brown MRN [id number]  Date of birth: July 10, 1970  CC & HPI:  Jenny Brown is a 46 y.o. female presenting today for menorrhagia with irregular cycles for 93 days. Pt reports she has experienced menorrhagia for the past 3 years, and this episode is the longest amount of time she has experienced the bleeding. Pt has associated symptoms of abdominal cramping. She states her bleeding stops for 4-5 days, and then starts again. Patient's last menstrual period was 01/18/2017. She has no associated symptoms. Pt denies trying any medications or hormone therapy to manage her issue, she does use ibuprofen P RM. No alleviating factors noted. Pt takes PO iron. She was last seen by Dr. Evette Doffing at [facility] on 03/03/17 for dizziness, blurred vision, and HA, who referred her to office. Pt had CBC with differential done on 03/03/17, with low hemoglobin: 9.3 g/dL.  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 Pt has a pap smear done every year. Pt has hx of HPT and seizures. She has not had an IUD before. She did have an U/S done.  ROS:  ROS  +menorrhagia +abdominal cramping -fever All systems are negative except as noted in the HPI and PMH.   Pertinent History Reviewed:   Reviewed: Significant for tubal ligation, HPT, menorrhagia  Medical         Past Medical History:  Diagnosis Date  . Essential hypertension, benign   . Hypertension   . OSA (obstructive sleep apnea)   . Restless legs syndrome   . Seizures ([place])                               Surgical Hx:    Past Surgical History:  Procedure Laterality Date  . TUBAL LIGATION     Medications: Reviewed & Updated - see associated section                       Current Outpatient Prescriptions:  .  cyclobenzaprine (FLEXERIL) 5 MG tablet, Take 1 tablet (5  mg total) by mouth 2 (two) times daily as needed for muscle spasms., Disp: 30 tablet, Rfl: 1 .  ferrous sulfate 325 (65 FE) MG tablet, Take 325 mg by mouth 3 (three) times daily with meals. , Disp: , Rfl:  .  ibuprofen (ADVIL,MOTRIN) 200 MG tablet, Take 600 mg by mouth every 6 (six) hours as needed for headache (pain)., Disp: , Rfl:  .  lisinopril-hydrochlorothiazide (PRINZIDE,ZESTORETIC) 20-25 MG tablet, Take 1 tablet by mouth daily., Disp: 90 tablet, Rfl: 3 .  sertraline (ZOLOFT) 50 MG tablet, Take 1 tablet (50 mg total) by mouth daily., Disp: 30 tablet, Rfl: 3   Social History: Reviewed -  reports that she has never smoked. She has never used smokeless tobacco.  Objective Findings:  Vitals: Blood pressure (!) 140/100, pulse 74, weight 179 lb (81.2 kg), last menstrual period 01/18/2017. Hemoglobin is 8.8, done as POCT in office here, was 9.3 at prior md office.  Physical Examination: General appearance - alert, well appearing, and in no distress Mental status - alert, oriented to person, place, and time Pelvic -  VULVA: normal appearing vulva with no masses, tenderness or  lesions,  VAGINA: normal appearing vagina with normal color and discharge, no lesions, good support CERVIX: normal appearing cervix without discharge or lesions, slightly open, irregular, possible polyp at upper limit of opening UTERUS: uterus is normal size, shape, consistency and nontender,  ADNEXA: normal adnexa in size, nontender and no masses  Discussion Pt informed of other option to hysterectomy procedure: endometrial ablation   Assessment & Plan:   A:  1. Rule out endocervical polyp 2. AUB  P:  1. Prescribe Megace 40 TID 2. Schedule a pelvic U/S and GYN/f/u visit for next week    By signing my name below, I, Jenny Brown, attest that this documentation has been prepared under the direction and in the presence of [name], MD. Electronically Signed: Jabier Gauss, Medical Scribe. 04/12/17. 3:39  PM.  I personally performed the services described in this documentation, which was SCRIBED in my presence. The recorded information has been reviewed and considered accurate. It has been edited as necessary during review. [name], MD

## 2017-04-12 NOTE — Addendum Note (Signed)
Addended by: Diona Fanti A on: 04/12/2017 04:06 PM   Modules accepted: Orders

## 2017-04-19 ENCOUNTER — Other Ambulatory Visit: Payer: Self-pay | Admitting: Obstetrics and Gynecology

## 2017-04-19 DIAGNOSIS — N939 Abnormal uterine and vaginal bleeding, unspecified: Secondary | ICD-10-CM

## 2017-04-20 ENCOUNTER — Encounter: Payer: Self-pay | Admitting: Obstetrics and Gynecology

## 2017-04-20 ENCOUNTER — Ambulatory Visit (INDEPENDENT_AMBULATORY_CARE_PROVIDER_SITE_OTHER): Payer: BC Managed Care – PPO

## 2017-04-20 DIAGNOSIS — N939 Abnormal uterine and vaginal bleeding, unspecified: Secondary | ICD-10-CM

## 2017-04-20 NOTE — Progress Notes (Signed)
PELVIC US TA/TV: homogeneous anteverted uterus,wnl,homogeneous thickened endometrium 25 mm,normal left ovary,simple right ovarian cyst 3.3 x 3.7 x 2.6 cm,no free fluid,left adnexal pain during ultrasound,no pain on right,ovaries appear mobile

## 2017-04-30 ENCOUNTER — Encounter: Payer: Self-pay | Admitting: Obstetrics and Gynecology

## 2017-04-30 ENCOUNTER — Ambulatory Visit (INDEPENDENT_AMBULATORY_CARE_PROVIDER_SITE_OTHER): Payer: BC Managed Care – PPO | Admitting: Obstetrics and Gynecology

## 2017-04-30 ENCOUNTER — Telehealth: Payer: Self-pay | Admitting: Obstetrics and Gynecology

## 2017-04-30 VITALS — BP 138/84 | HR 94 | Ht 60.0 in | Wt 174.2 lb

## 2017-04-30 DIAGNOSIS — N840 Polyp of corpus uteri: Secondary | ICD-10-CM

## 2017-04-30 DIAGNOSIS — R9389 Abnormal findings on diagnostic imaging of other specified body structures: Secondary | ICD-10-CM | POA: Diagnosis not present

## 2017-04-30 NOTE — Progress Notes (Signed)
[facility] Visit  04/30/2017            Patient name: Jenny Brown MRN [id number]  Date of birth: 1971-01-17  CC & HPI:  Jenny Brown is a 46 y.o. female presenting today for f/u visit after pelvic U/S due to AUB and to rule out endocervical polyp. Pt was last seen in office on 04/12/17 for AUB, and was prescribed Megace 40 TID. She was experiencing menorrhagia with irregular cycles for 93 days, per chart review. She states she is still bleeding and cramping even after taking the Megace. No alleviating factors noted. Pt has tried the Megace with no relief.    ROS:  ROS (-) fever (-) chills (+) vaginal bleeding (+) cramping All systems are negative except as noted in the HPI and PMH.   Pertinent History Reviewed:   Reviewed: Significant for tubal ligation, seizures Medical         Past Medical History:  Diagnosis Date  . Essential hypertension, benign   . Hypertension   . OSA (obstructive sleep apnea)   . Restless legs syndrome   . Seizures ([place])                               Surgical Hx:    Past Surgical History:  Procedure Laterality Date  . TUBAL LIGATION     Medications: Reviewed & Updated - see associated section                       Current Outpatient Prescriptions:  .  cyclobenzaprine (FLEXERIL) 5 MG tablet, Take 1 tablet (5 mg total) by mouth 2 (two) times daily as needed for muscle spasms., Disp: 30 tablet, Rfl: 1 .  ferrous sulfate 325 (65 FE) MG tablet, Take 325 mg by mouth 3 (three) times daily with meals. , Disp: , Rfl:  .  ibuprofen (ADVIL,MOTRIN) 200 MG tablet, Take 600 mg by mouth every 6 (six) hours as needed for headache (pain)., Disp: , Rfl:  .  lisinopril-hydrochlorothiazide (PRINZIDE,ZESTORETIC) 20-25 MG tablet, Take 1 tablet by mouth daily., Disp: 90 tablet, Rfl: 3 .  megestrol (MEGACE) 40 MG tablet, Take 1 tablet (40 mg total) by mouth 3 (three) times daily., Disp: 45 tablet, Rfl: 2 .  sertraline (ZOLOFT) 50 MG tablet, Take 1 tablet (50 mg  total) by mouth daily. (Patient not taking: Reported on 04/30/2017), Disp: 30 tablet, Rfl: 3   Social History: Reviewed -  reports that she has never smoked. She has never used smokeless tobacco.  Objective Findings:  Vitals: Blood pressure 138/84, pulse 94, height 5' (1.524 m), weight 174 lb 3.2 oz (79 kg).  Physical Examination: General appearance - alert, well appearing, and in no distress Mental status - alert, oriented to person, place, and time Pelvic - Not indicated  Endometrial Biopsy: Patient given informed consent, signed copy in the chart, time out was performed. Time out taken. The patient was placed in the lithotomy position and the cervix brought into view with sterile speculum.  Portio of cervix cleansed x 2 with betadine swabs.  A tenaculum was placed in the anterior lip of the cervix. Endometrial polyp was removed, by snipping the stalk.  All equipment was removed and accounted for.   The patient was slightly dizzy and remained seated for 10 minutes.    Patient given post procedure instructions.   Discussion: 1. Discussed with pt the results of  her pelvic ultrasound, as well as how to proceed with the abnormal thickening of the inside lining of her uterus. Pt agreed to endometrial biopsy done today.  Left ovary: 9 ml volume Rightt ovary: 24 ml volume   At end of discussion, pt had opportunity to ask questions and has no further questions at this time.   Specific discussion as noted above. Greater than 50% was spent in counseling and coordination of care with the patient.   Total time greater than: 25 minutes.    Assessment & Plan:   A:  1. Abnormal thickening of inside lining of uterus 2. ENDOMETRIAL POLYP   P:  1. Stop Megace  2.  Endometrial biopsy: polyp removed  3. F/u in 4 weeks, repeat pelvic U/S    By signing my name below, I, Izna Ahmed, attest that this documentation has been prepared under the direction and in the presence of [name],  MD. Electronically Signed: Jabier Gauss, Medical Scribe. 04/30/17. 12:43 PM.  I personally performed the services described in this documentation, which was SCRIBED in my presence. The recorded information has been reviewed and considered accurate. It has been edited as necessary during review. [name], MD

## 2017-05-04 ENCOUNTER — Telehealth: Payer: Self-pay | Admitting: *Deleted

## 2017-05-04 NOTE — Telephone Encounter (Signed)
Patient called stating she is still having dark [place] spotting and just wants to make sure its normal. Informed patient that it was probably old blood. Pt verbalized understanding.

## 2017-05-06 ENCOUNTER — Encounter: Payer: Self-pay | Admitting: Obstetrics and Gynecology

## 2017-05-09 NOTE — Telephone Encounter (Signed)
No additional notes at this time

## 2017-05-10 ENCOUNTER — Telehealth: Payer: Self-pay | Admitting: *Deleted

## 2017-05-10 NOTE — Telephone Encounter (Signed)
Attempted to call patient but was not successful. Mychart message to patient to check on her.

## 2017-05-12 ENCOUNTER — Ambulatory Visit: Payer: BC Managed Care – PPO | Admitting: Obstetrics and Gynecology

## 2017-05-12 ENCOUNTER — Encounter: Payer: Self-pay | Admitting: Obstetrics and Gynecology

## 2017-05-12 ENCOUNTER — Other Ambulatory Visit: Payer: Self-pay | Admitting: Obstetrics and Gynecology

## 2017-05-12 VITALS — BP 140/90 | HR 98 | Ht 61.0 in | Wt 173.6 lb

## 2017-05-12 DIAGNOSIS — N840 Polyp of corpus uteri: Secondary | ICD-10-CM

## 2017-05-12 DIAGNOSIS — D649 Anemia, unspecified: Secondary | ICD-10-CM | POA: Diagnosis not present

## 2017-05-12 DIAGNOSIS — Z01818 Encounter for other preprocedural examination: Secondary | ICD-10-CM | POA: Diagnosis not present

## 2017-05-12 LAB — POCT HEMOGLOBIN: Hemoglobin: 8.5 g/dL — AB (ref 12.2–16.2)

## 2017-05-12 NOTE — Patient Instructions (Signed)
Jenny Brown  05/12/2017     @PREFPERIOPPHARMACY @   Your procedure is scheduled on  05/14/2017 .  Report to [facility] at  1200  P.M.  Call this number if you have problems the morning of surgery:  [phone]   Remember:  Do not eat food or drink liquids after midnight.  Take these medicines the morning of surgery with A SIP OF WATER  Lisinopril.   Do not wear jewelry, make-up or nail polish.  Do not wear lotions, powders, or perfumes, or deoderant.  Do not shave 48 hours prior to surgery.  Men may shave face and neck.  Do not bring valuables to the hospital.  [facility] is not responsible for any belongings or valuables.  Contacts, dentures or bridgework may not be worn into surgery.  Leave your suitcase in the car.  After surgery it may be brought to your room.  For patients admitted to the hospital, discharge time will be determined by your treatment team.  Patients discharged the day of surgery will not be allowed to drive home.   Name and phone number of your driver:   family Special instructions:  None  Please read over the following fact sheets that you were given. Anesthesia Post-op Instructions and Care and Recovery After Surgery       Dilation and Curettage or Vacuum Curettage Dilation and curettage (D&C) and vacuum curettage are minor procedures. A D&C involves stretching (dilation) the cervix and scraping (curettage) the inside lining of the uterus (endometrium). During a D&C, tissue is gently scraped from the endometrium, starting from the top portion of the uterus down to the lowest part of the uterus (cervix). During a vacuum curettage, the lining and tissue in the uterus are removed with the use of gentle suction. Curettage may be performed to either diagnose or treat a problem. As a diagnostic procedure, curettage is performed to examine tissues from the uterus. A diagnostic curettage may be done if you have:  Irregular  bleeding in the uterus.  Bleeding with the development of clots.  Spotting between menstrual periods.  Prolonged menstrual periods or other abnormal bleeding.  Bleeding after menopause.  No menstrual period (amenorrhea).  A change in size and shape of the uterus.  Abnormal endometrial cells discovered during a Pap test.  As a treatment procedure, curettage may be performed for the following reasons:  Removal of an IUD (intrauterine device).  Removal of retained placenta after giving birth.  Abortion.  Miscarriage.  Removal of endometrial polyps.  Removal of uncommon types of noncancerous lumps (fibroids).  Tell a health care provider about:  Any allergies you have, including allergies to prescribed medicine or latex.  All medicines you are taking, including vitamins, herbs, eye drops, creams, and over-the-counter medicines. This is especially important if you take any blood-thinning medicine. Bring a list of all of your medicines to your appointment.  Any problems you or family members have had with anesthetic medicines.  Any blood disorders you have.  Any surgeries you have had.  Your medical history and any medical conditions you have.  Whether you are pregnant or may be pregnant.  Recent vaginal infections you have had.  Recent menstrual periods, bleeding problems you have had, and what form of birth control (contraception) you use. What are the risks? Generally, this is a safe procedure. However, problems may occur, including:  Infection.  Heavy vaginal bleeding.  Allergic reactions to medicines.  Damage to the cervix or other structures or organs.  Development of scar tissue (adhesions) inside the uterus, which can cause abnormal amounts of menstrual bleeding. This may make it harder to get pregnant in the future.  A hole (perforation) or puncture in the uterine wall. This is rare.  What happens before the procedure? Staying hydrated Follow  instructions from your health care provider about hydration, which may include:  Up to 2 hours before the procedure - you may continue to drink clear liquids, such as water, clear fruit juice, black coffee, and plain tea.  Eating and drinking restrictions Follow instructions from your health care provider about eating and drinking, which may include:  8 hours before the procedure - stop eating heavy meals or foods such as meat, fried foods, or fatty foods.  6 hours before the procedure - stop eating light meals or foods, such as toast or cereal.  6 hours before the procedure - stop drinking milk or drinks that contain milk.  2 hours before the procedure - stop drinking clear liquids. If your health care provider told you to take your medicine(s) on the day of your procedure, take them with only a sip of water.  Medicines  Ask your health care provider about: ? Changing or stopping your regular medicines. This is especially important if you are taking diabetes medicines or blood thinners. ? Taking medicines such as aspirin and ibuprofen. These medicines can thin your blood. Do not take these medicines before your procedure if your health care provider instructs you not to.  You may be given antibiotic medicine to help prevent infection. General instructions  For 24 hours before your procedure, do not: ? Douche. ? Use tampons. ? Use medicines, creams, or suppositories in the vagina. ? Have sexual intercourse.  You may be given a pregnancy test on the day of the procedure.  Plan to have someone take you home from the hospital or clinic.  You may have a blood or urine sample taken.  If you will be going home right after the procedure, plan to have someone with you for 24 hours. What happens during the procedure?  To reduce your risk of infection: ? Your health care team will wash or sanitize their hands. ? Your skin will be washed with soap.  An IV tube will be inserted into  one of your veins.  You will be given one of the following: ? A medicine that numbs the area in and around the cervix (local anesthetic). ? A medicine to make you fall asleep (general anesthetic).  You will lie down on your back, with your feet in foot rests (stirrups).  The size and position of your uterus will be checked.  A lubricated instrument (speculum or Sims retractor) will be inserted into the back side of your vagina. The speculum will be used to hold apart the walls of your vagina so your health care provider can see your cervix.  A tool (tenaculum) will be attached to the lip of the cervix to stabilize it.  Your cervix will be softened and dilated. This may be done by: ? Taking a medicine. ? Having tapered dilators or thin rods (laminaria) or gradual widening instruments (tapered dilators) inserted into your cervix.  A small, sharp, curved instrument (curette) will be used to scrape a small amount of tissue or cells from the endometrium or cervical canal. In some cases, gentle suction is applied with the curette. The curette  will then be removed. The cells will be taken to a lab for testing. The procedure may vary among health care providers and hospitals. What happens after the procedure?  You may have mild cramping, backache, pain, and light bleeding or spotting. You may pass small blood clots from your vagina.  You may have to wear compression stockings. These stockings help to prevent blood clots and reduce swelling in your legs.  Your blood pressure, heart rate, breathing rate, and blood oxygen level will be monitored until the medicines you were given have worn off. Summary  Dilation and curettage (D&C) involves stretching (dilation) the cervix and scraping (curettage) the inside lining of the uterus (endometrium).  After the procedure, you may have mild cramping, backache, pain, and light bleeding or spotting. You may pass small blood clots from your vagina.  Plan  to have someone take you home from the hospital or clinic. This information is not intended to replace advice given to you by your health care provider. Make sure you discuss any questions you have with your health care provider. Document Released: 06/15/2005 Document Revised: 03/01/2016 Document Reviewed: 03/01/2016 Elsevier Interactive Patient Education  2018 [employer].  Dilation and Curettage or Vacuum Curettage, Care After These instructions give you information about caring for yourself after your procedure. Your doctor may also give you more specific instructions. Call your doctor if you have any problems or questions after your procedure. Follow these instructions at home: Activity  Do not drive or use heavy machinery while taking prescription pain medicine.  For 24 hours after your procedure, avoid driving.  Take short walks often, followed by rest periods. Ask your doctor what activities are safe for you. After one or two days, you may be able to return to your normal activities.  Do not lift anything that is heavier than 10 lb (4.5 kg) until your doctor approves.  For at least 2 weeks, or as long as told by your doctor: ? Do not douche. ? Do not use tampons. ? Do not have sex. General instructions  Take over-the-counter and prescription medicines only as told by your doctor. This is very important if you take blood thinning medicine.  Do not take baths, swim, or use a hot tub until your doctor approves. Take showers instead of baths.  Wear compression stockings as told by your doctor.  It is up to you to get the results of your procedure. Ask your doctor when your results will be ready.  Keep all follow-up visits as told by your doctor. This is important. Contact a doctor if:  You have very bad cramps that get worse or do not get better with medicine.  You have very bad pain in your belly (abdomen).  You cannot drink fluids without throwing up (vomiting).  You  get pain in a different part of the area between your belly and thighs (pelvis).  You have bad-smelling discharge from your vagina.  You have a rash. Get help right away if:  You are bleeding a lot from your vagina. A lot of bleeding means soaking more than one sanitary pad in an hour, for 2 hours in a row.  You have clumps of blood (blood clots) coming from your vagina.  You have a fever or chills.  Your belly feels very tender or hard.  You have chest pain.  You have trouble breathing.  You cough up blood.  You feel dizzy.  You feel light-headed.  You pass out (faint).  You  have pain in your neck or shoulder area. Summary  Take short walks often, followed by rest periods. Ask your doctor what activities are safe for you. After one or two days, you may be able to return to your normal activities.  Do not lift anything that is heavier than 10 lb (4.5 kg) until your doctor approves.  Do not take baths, swim, or use a hot tub until your doctor approves. Take showers instead of baths.  Contact your doctor if you have any symptoms of infection, like bad-smelling discharge from your vagina. This information is not intended to replace advice given to you by your health care provider. Make sure you discuss any questions you have with your health care provider. Document Released: 03/24/2008 Document Revised: 03/02/2016 Document Reviewed: 03/02/2016 Elsevier Interactive Patient Education  2017 [place]. Hysteroscopy Hysteroscopy is a procedure used for looking inside the womb (uterus). It may be done for various reasons, including:  To evaluate abnormal bleeding, fibroid (benign, noncancerous) tumors, polyps, scar tissue (adhesions), and possibly cancer of the uterus.  To look for lumps (tumors) and other uterine growths.  To look for causes of why a woman cannot get pregnant (infertility), causes of recurrent loss of pregnancy (miscarriages), or a lost intrauterine device  (IUD).  To perform a sterilization by blocking the fallopian tubes from inside the uterus.  In this procedure, a thin, flexible tube with a tiny light and camera on the end of it (hysteroscope) is used to look inside the uterus. A hysteroscopy should be done right after a menstrual period to be sure you are not pregnant. LET [facility] CARE PROVIDER KNOW ABOUT:  Any allergies you have.  All medicines you are taking, including vitamins, herbs, eye drops, creams, and over-the-counter medicines.  Previous problems you or members of your family have had with the use of anesthetics.  Any blood disorders you have.  Previous surgeries you have had.  Medical conditions you have. RISKS AND COMPLICATIONS Generally, this is a safe procedure. However, as with any procedure, complications can occur. Possible complications include:  Putting a hole in the uterus.  Excessive bleeding.  Infection.  Damage to the cervix.  Injury to other organs.  Allergic reaction to medicines.  Too much fluid used in the uterus for the procedure.  BEFORE THE PROCEDURE  Ask your health care provider about changing or stopping any regular medicines.  Do not take aspirin or blood thinners for 1 week before the procedure, or as directed by your health care provider. These can cause bleeding.  If you smoke, do not smoke for 2 weeks before the procedure.  In some cases, a medicine is placed in the cervix the day before the procedure. This medicine makes the cervix have a larger opening (dilate). This makes it easier for the instrument to be inserted into the uterus during the procedure.  Do not eat or drink anything for at least 8 hours before the surgery.  Arrange for someone to take you home after the procedure. PROCEDURE  You may be given a medicine to relax you (sedative). You may also be given one of the following: ? A medicine that numbs the area around the cervix (local anesthetic). ? A medicine  that makes you sleep through the procedure (general anesthetic).  The hysteroscope is inserted through the vagina into the uterus. The camera on the hysteroscope sends a picture to a TV screen. This gives the surgeon a good view inside the uterus.  During the  procedure, air or a liquid is put into the uterus, which allows the surgeon to see better.  Sometimes, tissue is gently scraped from inside the uterus. These tissue samples are sent to a lab for testing. What to expect after the procedure  If you had a general anesthetic, you may be groggy for a couple hours after the procedure.  If you had a local anesthetic, you will be able to go home as soon as you are stable and feel ready.  You may have some cramping. This normally lasts for a couple days.  You may have bleeding, which varies from light spotting for a few days to menstrual-like bleeding for 3-7 days. This is normal.  If your test results are not back during the visit, make an appointment with your health care provider to find out the results. This information is not intended to replace advice given to you by your health care provider. Make sure you discuss any questions you have with your health care provider. Document Released: 09/21/2000 Document Revised: 11/21/2015 Document Reviewed: 01/12/2013 Elsevier Interactive Patient Education  2017 [place]. Hysteroscopy, Care After Refer to this sheet in the next few weeks. These instructions provide you with information on caring for yourself after your procedure. Your health care provider may also give you more specific instructions. Your treatment has been planned according to current medical practices, but problems sometimes occur. Call your health care provider if you have any problems or questions after your procedure. What can I expect after the procedure? After your procedure, it is typical to have the following:  You may have some cramping. This normally lasts for a  couple days.  You may have bleeding. This can vary from light spotting for a few days to menstrual-like bleeding for 3-7 days.  Follow these instructions at home:  Rest for the first 1-2 days after the procedure.  Only take over-the-counter or prescription medicines as directed by your health care provider. Do not take aspirin. It can increase the chances of bleeding.  Take showers instead of baths for 2 weeks or as directed by your health care provider.  Do not drive for 24 hours or as directed.  Do not drink alcohol while taking pain medicine.  Do not use tampons, douche, or have sexual intercourse for 2 weeks or until your health care provider says it is okay.  Take your temperature twice a day for 4-5 days. Write it down each time.  Follow your health care provider's advice about diet, exercise, and lifting.  If you develop constipation, you may: ? Take a mild laxative if your health care provider approves. ? Add bran foods to your diet. ? Drink enough fluids to keep your urine clear or pale yellow.  Try to have someone with you or available to you for the first 24-48 hours, especially if you were given a general anesthetic.  Follow up with your health care provider as directed. Contact a health care provider if:  You feel dizzy or lightheaded.  You feel sick to your stomach (nauseous).  You have abnormal vaginal discharge.  You have a rash.  You have pain that is not controlled with medicine. Get help right away if:  You have bleeding that is heavier than a normal menstrual period.  You have a fever.  You have increasing cramps or pain, not controlled with medicine.  You have new belly (abdominal) pain.  You pass out.  You have pain in the tops of your shoulders (  shoulder strap areas).  You have shortness of breath. This information is not intended to replace advice given to you by your health care provider. Make sure you discuss any questions you have  with your health care provider. Document Released: 04/05/2013 Document Revised: 11/21/2015 Document Reviewed: 01/12/2013 Elsevier Interactive Patient Education  2017 [place].  Endometrial Ablation Endometrial ablation is a procedure that destroys the thin inner layer of the lining of the uterus (endometrium). This procedure may be done:  To stop heavy periods.  To stop bleeding that is causing anemia.  To control irregular bleeding.  To treat bleeding caused by small tumors (fibroids) in the endometrium.  This procedure is often an alternative to major surgery, such as removal of the uterus and cervix (hysterectomy). As a result of this procedure:  You may not be able to have children. However, if you are premenopausal (you have not gone through menopause): ? You may still have a small chance of getting pregnant. ? You will need to use a reliable method of birth control after the procedure to prevent pregnancy.  You may stop having a menstrual period, or you may have only a small amount of bleeding during your period. Menstruation may return several years after the procedure.  Tell a health care provider about:  Any allergies you have.  All medicines you are taking, including vitamins, herbs, eye drops, creams, and over-the-counter medicines.  Any problems you or family members have had with the use of anesthetic medicines.  Any blood disorders you have.  Any surgeries you have had.  Any medical conditions you have. What are the risks? Generally, this is a safe procedure. However, problems may occur, including:  A hole (perforation) in the uterus or bowel.  Infection of the uterus, bladder, or vagina.  Bleeding.  Damage to other structures or organs.  An air bubble in the lung (air embolus).  Problems with pregnancy after the procedure.  Failure of the procedure.  Decreased ability to diagnose cancer in the endometrium.  What happens before the  procedure?  You will have tests of your endometrium to make sure there are no pre-cancerous cells or cancer cells present.  You may have an ultrasound of the uterus.  You may be given medicines to thin the endometrium.  Ask your health care provider about: ? Changing or stopping your regular medicines. This is especially important if you take diabetes medicines or blood thinners. ? Taking medicines such as aspirin and ibuprofen. These medicines can thin your blood. Do not take these medicines before your procedure if your doctor tells you not to.  Plan to have someone take you home from the hospital or clinic. What happens during the procedure?  You will lie on an exam table with your feet and legs supported as in a pelvic exam.  To lower your risk of infection: ? Your health care team will wash or sanitize their hands and put on germ-free (sterile) gloves. ? Your genital area will be washed with soap.  An IV tube will be inserted into one of your veins.  You will be given a medicine to help you relax (sedative).  A surgical instrument with a light and camera (resectoscope) will be inserted into your vagina and moved into your uterus. This allows your surgeon to see inside your uterus.  Endometrial tissue will be removed using one of the following methods: ? Radiofrequency. This method uses a radiofrequency-alternating electric current to remove the endometrium. ? Cryotherapy. This method  uses extreme cold to freeze the endometrium. ? Heated-free liquid. This method uses a heated saltwater (saline) solution to remove the endometrium. ? Microwave. This method uses high-energy microwaves to heat up the endometrium and remove it. ? Thermal balloon. This method involves inserting a catheter with a balloon tip into the uterus. The balloon tip is filled with heated fluid to remove the endometrium. The procedure may vary among health care providers and hospitals. What happens after the  procedure?  Your blood pressure, heart rate, breathing rate, and blood oxygen level will be monitored until the medicines you were given have worn off.  As tissue healing occurs, you may notice vaginal bleeding for 4-6 weeks after the procedure. You may also experience: ? Cramps. ? Thin, watery vaginal discharge that is light pink or [place] in color. ? A need to urinate more frequently than usual. ? Nausea.  Do not drive for 24 hours if you were given a sedative.  Do not have sex or insert anything into your vagina until your health care provider approves. Summary  Endometrial ablation is done to treat the many causes of heavy menstrual bleeding.  The procedure may be done only after medications have been tried to control the bleeding.  Plan to have someone take you home from the hospital or clinic. This information is not intended to replace advice given to you by your health care provider. Make sure you discuss any questions you have with your health care provider. Document Released: 04/24/2004 Document Revised: 07/02/2016 Document Reviewed: 07/02/2016 Elsevier Interactive Patient Education  2017 [place] Anesthesia, Adult General anesthesia is the use of medicines to make a person "go to sleep" (be unconscious) for a medical procedure. General anesthesia is often recommended when a procedure:  Is long.  Requires you to be still or in an unusual position.  Is major and can cause you to lose blood.  Is impossible to do without general anesthesia.  The medicines used for general anesthesia are called general anesthetics. In addition to making you sleep, the medicines:  Prevent pain.  Control your blood pressure.  Relax your muscles.  Tell a health care provider about:  Any allergies you have.  All medicines you are taking, including vitamins, herbs, eye drops, creams, and over-the-counter medicines.  Any problems you or family members have had with  anesthetic medicines.  Types of anesthetics you have had in the past.  Any bleeding disorders you have.  Any surgeries you have had.  Any medical conditions you have.  Any history of heart or lung conditions, such as heart failure, sleep apnea, or chronic obstructive pulmonary disease (COPD).  Whether you are pregnant or may be pregnant.  Whether you use tobacco, alcohol, marijuana, or street drugs.  Any history of Armed forces logistics/support/administrative officer.  Any history of depression or anxiety. What are the risks? Generally, this is a safe procedure. However, problems may occur, including:  Allergic reaction to anesthetics.  Lung and heart problems.  Inhaling food or liquids from your stomach into your lungs (aspiration).  Injury to nerves.  Waking up during your procedure and being unable to move (rare).  Extreme agitation or a state of mental confusion (delirium) when you wake up from the anesthetic.  Air in the bloodstream, which can lead to stroke.  These problems are more likely to develop if you are having a major surgery or if you have an advanced medical condition. You can prevent some of these complications by answering all of  your health care provider's questions thoroughly and by following all pre-procedure instructions. General anesthesia can cause side effects, including:  Nausea or vomiting  A sore throat from the breathing tube.  Feeling cold or shivery.  Feeling tired, washed out, or achy.  Sleepiness or drowsiness.  Confusion or agitation.  What happens before the procedure? Staying hydrated Follow instructions from your health care provider about hydration, which may include:  Up to 2 hours before the procedure - you may continue to drink clear liquids, such as water, clear fruit juice, black coffee, and plain tea.  Eating and drinking restrictions Follow instructions from your health care provider about eating and drinking, which may include:  8 hours before the  procedure - stop eating heavy meals or foods such as meat, fried foods, or fatty foods.  6 hours before the procedure - stop eating light meals or foods, such as toast or cereal.  6 hours before the procedure - stop drinking milk or drinks that contain milk.  2 hours before the procedure - stop drinking clear liquids.  Medicines  Ask your health care provider about: ? Changing or stopping your regular medicines. This is especially important if you are taking diabetes medicines or blood thinners. ? Taking medicines such as aspirin and ibuprofen. These medicines can thin your blood. Do not take these medicines before your procedure if your health care provider instructs you not to. ? Taking new dietary supplements or medicines. Do not take these during the week before your procedure unless your health care provider approves them.  If you are told to take a medicine or to continue taking a medicine on the day of the procedure, take the medicine with sips of water. General instructions   Ask if you will be going home the same day, the following day, or after a longer hospital stay. ? Plan to have someone take you home. ? Plan to have someone stay with you for the first 24 hours after you leave the hospital or clinic.  For 3-6 weeks before the procedure, try not to use any tobacco products, such as cigarettes, chewing tobacco, and e-cigarettes.  You may brush your teeth on the morning of the procedure, but make sure to spit out the toothpaste. What happens during the procedure?  You will be given anesthetics through a mask and through an IV tube in one of your veins.  You may receive medicine to help you relax (sedative).  As soon as you are asleep, a breathing tube may be used to help you breathe.  An anesthesia specialist will stay with you throughout the procedure. He or she will help keep you comfortable and safe by continuing to give you medicines and adjusting the amount of  medicine that you get. He or she will also watch your blood pressure, pulse, and oxygen levels to make sure that the anesthetics do not cause any problems.  If a breathing tube was used to help you breathe, it will be removed before you wake up. The procedure may vary among health care providers and hospitals. What happens after the procedure?  You will wake up, often slowly, after the procedure is complete, usually in a recovery area.  Your blood pressure, heart rate, breathing rate, and blood oxygen level will be monitored until the medicines you were given have worn off.  You may be given medicine to help you calm down if you feel anxious or agitated.  If you will be going home the same  day, your health care provider may check to make sure you can stand, drink, and urinate.  Your health care providers will treat your pain and side effects before you go home.  Do not drive for 24 hours if you received a sedative.  You may: ? Feel nauseous and vomit. ? Have a sore throat. ? Have mental slowness. ? Feel cold or shivery. ? Feel sleepy. ? Feel tired. ? Feel sore or achy, even in parts of your body where you did not have surgery. This information is not intended to replace advice given to you by your health care provider. Make sure you discuss any questions you have with your health care provider. Document Released: 09/22/2007 Document Revised: 11/26/2015 Document Reviewed: 05/30/2015 Elsevier Interactive Patient Education  2018 [place] Anesthesia, Adult, Care After These instructions provide you with information about caring for yourself after your procedure. Your health care provider may also give you more specific instructions. Your treatment has been planned according to current medical practices, but problems sometimes occur. Call your health care provider if you have any problems or questions after your procedure. What can I expect after the procedure? After the  procedure, it is common to have:  Vomiting.  A sore throat.  Mental slowness.  It is common to feel:  Nauseous.  Cold or shivery.  Sleepy.  Tired.  Sore or achy, even in parts of your body where you did not have surgery.  Follow these instructions at home: For at least 24 hours after the procedure:  Do not: ? Participate in activities where you could fall or become injured. ? Drive. ? Use heavy machinery. ? Drink alcohol. ? Take sleeping pills or medicines that cause drowsiness. ? Make important decisions or sign legal documents. ? Take care of children on your own.  Rest. Eating and drinking  If you vomit, drink water, juice, or soup when you can drink without vomiting.  Drink enough fluid to keep your urine clear or pale yellow.  Make sure you have little or no nausea before eating solid foods.  Follow the diet recommended by your health care provider. General instructions  Have a responsible adult stay with you until you are awake and alert.  Return to your normal activities as told by your health care provider. Ask your health care provider what activities are safe for you.  Take over-the-counter and prescription medicines only as told by your health care provider.  If you smoke, do not smoke without supervision.  Keep all follow-up visits as told by your health care provider. This is important. Contact a health care provider if:  You continue to have nausea or vomiting at home, and medicines are not helpful.  You cannot drink fluids or start eating again.  You cannot urinate after 8-12 hours.  You develop a skin rash.  You have fever.  You have increasing redness at the site of your procedure. Get help right away if:  You have difficulty breathing.  You have chest pain.  You have unexpected bleeding.  You feel that you are having a life-threatening or urgent problem. This information is not intended to replace advice given to you by your  health care provider. Make sure you discuss any questions you have with your health care provider. Document Released: 09/21/2000 Document Revised: 11/18/2015 Document Reviewed: 05/30/2015 Elsevier Interactive Patient Education  [employer].

## 2017-05-12 NOTE — Progress Notes (Addendum)
Patient ID: Jenny Brown, female   DOB: 20-Aug-1970, 46 y.o.   MRN: [id number] Preoperative History and Physical  Jenny Brown is a 46 y.o. G2P2 here for surgical management of menorrhagia  that has been on-going for the last 140-150 days.sHE HAD A PARTIALLY PROLAPSING ENDOMETRIAL POLYP REMOVED IN THE OFFICE SETTING LATE oCTOBER, WITH BENIGN REPORT  She reports the bleeding isn't as bad today, however, she reports this has been on going iN [place] 3 years. Associated symptoms include headaches and abdominal cramping. Pt was seen on 04/12/2017 for same. 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 After her last visit she became lightheaded and had a syncopal episode. Otherwise she denies fever, chills, or any other symptoms at this time. No significant preoperative concerns. She is passing clots even noted today  Proposed surgery: Hysteroscopy/ D&C, Removal of Endometrial Polyp and Endometrial Ablation, Friday 05/14/2017  Past Medical History:  Diagnosis Date  . Essential hypertension, benign   . Hypertension   . OSA (obstructive sleep apnea)   . Restless legs syndrome   . Seizures ([place])    Past Surgical History:  Procedure Laterality Date  . TUBAL LIGATION     OB History  Gravida Para Term Preterm AB Living  2 2          SAB TAB Ectopic Multiple Live Births               # Outcome Date GA Lbr Len/2nd Weight Sex Delivery Anes PTL Lv  2 Para           1 Para             Patient denies any other pertinent gynecologic issues.   Current Outpatient Medications on File Prior to Visit  Medication Sig Dispense Refill  . cyclobenzaprine (FLEXERIL) 5 MG tablet Take 1 tablet (5 mg total) by mouth 2 (two) times daily as needed for muscle spasms. 30 tablet 1  . ferrous sulfate 325 (65 FE) MG tablet Take 325 mg by mouth 3 (three) times daily with meals.     Marland Kitchen ibuprofen (ADVIL,MOTRIN) 200 MG tablet  Take 600 mg by mouth every 6 (six) hours as needed for headache (pain).    Marland Kitchen lisinopril-hydrochlorothiazide (PRINZIDE,ZESTORETIC) 20-25 MG tablet Take 1 tablet by mouth daily. 90 tablet 3  . sertraline (ZOLOFT) 50 MG tablet Take 1 tablet (50 mg total) by mouth daily. 30 tablet 3  . megestrol (MEGACE) 40 MG tablet Take 1 tablet (40 mg total) by mouth 3 (three) times daily. (Patient not taking: Reported on 05/12/2017) 45 tablet 2   No current facility-administered medications on file prior to visit.    No Known Allergies  Social History:   reports that  has never smoked. she has never used smokeless tobacco. She reports that she does not drink alcohol or use drugs.  Family History  Problem Relation Age of Onset  . Heart disease Mother   . Diabetes Mother     Review of Systems: +menorrhagia +HA +abdominal cramping +lightheaded +LOC -fever -chills All systems are negative except as noted in the HPI and PMH.   PHYSICAL EXAM: Blood pressure 140/90, pulse 98, height 5\' 1"  (1.549 m), weight 173 lb 9.6 oz (78.7 kg). General appearance - alert, well appearing, and in no distress Chest - clear to auscultation, no wheezes, rales or rhonchi, symmetric air  entry Heart - normal rate and regular rhythm Abdomen - soft, nontender, nondistended, no masses or organomegaly Pelvic - Passed 6 cm X 4 cm blood clot, BRB          External genitalia normal          Large clots in the vaginal vault with fresh bleeding          Bimanual reveals the cervix to be somewhat dilated about 1 cm. I think she is trying to prolapse some more of the polyp Transvaginal ultrasound was used to confirm today's visit and shows some irregularity in the endometrial cavity. This is more the old polyp. This is compared to the previous ultrasound 04/22/2010 where the endometrial polyp as well defined  Extremities - peripheral pulses normal, no pedal edema, no clubbing or cyanosis  Labs: Results for orders placed or  performed in visit on 05/12/17 (from the past 336 hour(s))  POCT hemoglobin   Collection Time: 05/12/17  9:12 AM  Result Value Ref Range   Hemoglobin 8.5 (A) 12.2 - 16.2 g/dL    Imaging Studies: No results found.  Assessment: 1. Severe Menorrhagia 2. Endometrial Polyp  Patient Active Problem List   Diagnosis Date Noted  . BPPV (benign paroxysmal positional vertigo) 07/14/2016  . Numbness and tingling of right arm and leg 05/14/2016  . Acute bilateral low back pain with bilateral sciatica 05/14/2016  . Postconcussive syndrome 04/23/2016  . Anemia 04/23/2016  . Concussion with loss of consciousness   . Generalized weakness   . Spells of decreased attentiveness   . Insomnia 12/26/2015  . Precordial pain 03/19/2014  . Essential hypertension, benign 03/19/2014  . OSA (obstructive sleep apnea) 03/19/2014    Plan: 1. Patient will undergo surgical management with Hysteroscopy/ D&C, Removal of Endometrial Polyp and Endometrial Ablation, Friday 11/16/2018at 1:30 PM.Lab appointment tomorrow at 1:30 PM  2. F/U 2 weeks for post-op  .mec 05/12/2017 10:02 AM  By signing my name below, I, [name], attest that this documentation has been prepared under the direction and in the presence of [name], MD. Electronically Signed: [name], Medical Scribe. 05/12/17. 10:02 AM.  I personally performed the services described in this documentation, which was SCRIBED in my presence. The recorded information has been reviewed and considered accurate. It has been edited as necessary during review. [name], MD

## 2017-05-13 ENCOUNTER — Encounter (HOSPITAL_COMMUNITY)
Admission: RE | Admit: 2017-05-13 | Discharge: 2017-05-13 | Disposition: A | Discharge status: Home / Self Care | Payer: BC Managed Care – PPO | Source: Ambulatory Visit | Attending: Obstetrics and Gynecology | Admitting: Obstetrics and Gynecology

## 2017-05-14 ENCOUNTER — Ambulatory Visit (HOSPITAL_COMMUNITY)
Admission: RE | Admit: 2017-05-14 | Payer: BC Managed Care – PPO | Source: Ambulatory Visit | Admitting: Obstetrics and Gynecology

## 2017-05-14 ENCOUNTER — Encounter (HOSPITAL_COMMUNITY): Admission: RE | Payer: Self-pay | Source: Ambulatory Visit

## 2017-05-14 SURGERY — DILATATION & CURETTAGE/HYSTEROSCOPY WITH NOVASURE ABLATION
Anesthesia: General

## 2017-05-21 NOTE — Progress Notes (Signed)
[facility] Visit  05/21/2017            Patient name: Jenny Brown MRN [id number]  Date of birth: Jul 08, 1970  CC & HPI:  Sarajane Fambrough is a 46 y.o. female presenting today for f/u visit after pelvic U/S due to AUB and to rule out endocervical polyp. Pt was last seen in office on 04/12/17 for AUB, and was prescribed Megace 40 TID. She was experiencing menorrhagia with irregular cycles for 93 days, per chart review. She states she is still bleeding and cramping even after taking the Megace. No alleviating factors noted. Pt has tried the Megace with no relief.  Pt had u/s last week showing a thickened endometrium 2.5 cm with hyperechoic symmetric endometrial thickening.  ROS:  ROS (-) fever (-) chills (+) vaginal bleeding (+) cramping All systems are negative except as noted in the HPI and PMH.   Pertinent History Reviewed:   Reviewed: Significant for tubal ligation, seizures Medical         Past Medical History:  Diagnosis Date  . Essential hypertension, benign   . Hypertension   . OSA (obstructive sleep apnea)   . Restless legs syndrome   . Seizures ([place])                               Surgical Hx:    Past Surgical History:  Procedure Laterality Date  . TUBAL LIGATION     Medications: Reviewed & Updated - see associated section                       Current Outpatient Medications:  .  cyclobenzaprine (FLEXERIL) 5 MG tablet, Take 1 tablet (5 mg total) by mouth 2 (two) times daily as needed for muscle spasms. (Patient not taking: Reported on 05/12/2017), Disp: 30 tablet, Rfl: 1 .  ferrous sulfate 325 (65 FE) MG tablet, Take 325 mg by mouth 3 (three) times daily with meals. , Disp: , Rfl:  .  ibuprofen (ADVIL,MOTRIN) 200 MG tablet, Take 600 mg every 6 (six) hours as needed by mouth for headache or moderate pain. , Disp: , Rfl:  .  lisinopril-hydrochlorothiazide (PRINZIDE,ZESTORETIC) 20-25 MG tablet, Take 1 tablet by mouth daily., Disp: 90 tablet, Rfl: 3 .  megestrol  (MEGACE) 40 MG tablet, Take 1 tablet (40 mg total) by mouth 3 (three) times daily. (Patient not taking: Reported on 05/12/2017), Disp: 45 tablet, Rfl: 2 .  sertraline (ZOLOFT) 50 MG tablet, Take 1 tablet (50 mg total) by mouth daily. (Patient not taking: Reported on 05/12/2017), Disp: 30 tablet, Rfl: 3   Social History: Reviewed -  reports that  has never smoked. she has never used smokeless tobacco.  Objective Findings:  Vitals: Blood pressure 138/84, pulse 94, height 5' (1.524 m), weight 174 lb 3.2 oz (79 kg).  Physical Examination: General appearance - alert, well appearing, and in no distress Mental status - alert, oriented to person, place, and time Pelvic - Not indicated  Endometrial Biopsy: Patient given informed consent, signed copy in the chart, time out was performed. Time out taken. The patient was placed in the lithotomy position and the cervix brought into view with sterile speculum.  Portio of cervix cleansed x 2 with betadine swabs.  A tenaculum was placed in the anterior lip of the cervix. Endometrial polyp was removed, by snipping the stalk.  All equipment was removed and accounted for.  The patient was slightly dizzy and remained seated for 10 minutes.    Patient given post procedure instructions.   Discussion: 1. Discussed with pt the results of her pelvic ultrasound, as well as how to proceed with the abnormal thickening of the inside lining of her uterus. Pt agreed to endometrial biopsy done today.  Left ovary: 9 ml volume Rightt ovary: 24 ml volume   At end of discussion, pt had opportunity to ask questions and has no further questions at this time.   Specific discussion as noted above. Greater than 50% was spent in counseling and coordination of care with the patient.   Total time greater than: 25 minutes.  Pef Physical Examination: General appearance - alert, well appearing, and in no distress and anxious Mental status - alert, oriented to person, place, and  time, normal mood, behavior, speech, dress, motor activity, and thought processes Chest - clear to auscultation, no wheezes, rales or rhonchi, symmetric air entry Abdomen - soft, nontender, nondistended, no masses or organomegaly Pelvic - normal external genitalia, vulva, vagina, cervix, uterus and adnexa, examination not indicated, VAGINA: normal appearing vagina with normal color and discharge, no lesions, CERVIX: normal appearing cervix without discharge or lesions, endocervical  Or endometrial polyp size 2 cm, removed high in endocervical canal UTERUS: uterus is normal size, shape, consistency and nontender, anteverted, ADNEXA: normal adnexa in size, nontender and no masses   Assessment & Plan:   A:  1. Abnormal thickening of inside lining of uterus 2. Endometrial polyp  P:  1. Stop Megace 2.  Endometrial biopsy: polyp removed and sent for pathology ' bENIGN ENDOMETRIAL MUSCLE" Endometrial polyp - BENIGN ENDOMETRIUM LINING SMOOTH MUSCLE TISSUE SHOWING DEGENERATIVE AND HEMORRHAGIC CHANGES. SEE COMMENT. - NO MALIGNANCY IDENTIFIED. Microscopic Comment 3. F/u in 4 weeks, repeat pelvic U/S    By signing my name below, I, Izna Ahmed, attest that this documentation has been prepared under the direction and in the presence of [name], MD. Electronically Signed: Jabier Gauss, Medical Scribe. 04/30/17. 7:20 AM.  I personally performed the services described in this documentation, which was SCRIBED in my presence. The recorded information has been reviewed and considered accurate. It has been edited as necessary during review. [name], MD

## 2017-05-24 ENCOUNTER — Encounter: Payer: BC Managed Care – PPO | Admitting: Obstetrics and Gynecology

## 2017-05-28 ENCOUNTER — Other Ambulatory Visit: Payer: BC Managed Care – PPO

## 2017-05-28 ENCOUNTER — Ambulatory Visit: Payer: BC Managed Care – PPO | Admitting: Obstetrics and Gynecology

## 2017-06-01 ENCOUNTER — Other Ambulatory Visit: Payer: Self-pay | Admitting: Obstetrics and Gynecology

## 2017-06-01 DIAGNOSIS — N840 Polyp of corpus uteri: Secondary | ICD-10-CM

## 2017-06-02 ENCOUNTER — Ambulatory Visit: Payer: BC Managed Care – PPO | Admitting: Obstetrics and Gynecology

## 2017-06-02 ENCOUNTER — Encounter: Payer: Self-pay | Admitting: Obstetrics and Gynecology

## 2017-06-02 ENCOUNTER — Ambulatory Visit (INDEPENDENT_AMBULATORY_CARE_PROVIDER_SITE_OTHER): Payer: BC Managed Care – PPO

## 2017-06-02 VITALS — BP 108/60 | HR 97 | Ht 60.0 in | Wt 177.2 lb

## 2017-06-02 DIAGNOSIS — N840 Polyp of corpus uteri: Secondary | ICD-10-CM

## 2017-06-02 DIAGNOSIS — D500 Iron deficiency anemia secondary to blood loss (chronic): Secondary | ICD-10-CM

## 2017-06-02 DIAGNOSIS — N939 Abnormal uterine and vaginal bleeding, unspecified: Secondary | ICD-10-CM

## 2017-06-02 NOTE — Progress Notes (Signed)
Patient ID: Jenny Brown, female   DOB: [birth year], 46 y.o.   MRN: [id number] Preoperative History and Physical  Jenny Brown is a 46 y.o. [id number] here for surgical management of severe menorrhagia she had a partially prolapsed.  Cervical polyp which was removed is high up inside the uterus is possible in the office several weeks ago.  She was scheduled for hysteroscopy D&C and probable endometrial ablation after that and decided to wait until the first of the year for insurance reasons she had an U/S done today, prior to being seen. Ultrasound shows a moderate amount of fluid in the endometrial cavity and it appears she is cramping as the fluid is moving dynamically  She has to change her pad about every hour and states today, she has passed a few large blood clots.  She has had some cramping into her back particular today.  She still committed to postponing the surgery until January she would like to have the surgery in the beginning of January 2019 because of her deductible. She notes the bleeding is worse on days she does increased heavy lifting. The patient denies any other symptoms or complaints at this time.   No significant preoperative concerns.  Proposed surgery: Hysteroscopy, D&C, removal of polyp and endometrial ablation  Past Medical History:  Diagnosis Date  . Essential hypertension, benign   . Hypertension   . OSA (obstructive sleep apnea)   . Restless legs syndrome   . Seizures ([place])    Past Surgical History:  Procedure Laterality Date  . TUBAL LIGATION     OB History  Gravida Para Term Preterm AB Living  2 2 2     2   SAB TAB Ectopic Multiple Live Births          2    # Outcome Date GA Lbr Len/2nd Weight Sex Delivery Anes PTL Lv  2 Term     F Vag-Spont   LIV  1 Term     M Vag-Spont   LIV    Patient denies any other pertinent gynecologic issues.   Current Outpatient Medications on File Prior to Visit  Medication Sig Dispense Refill  . ferrous sulfate 325 (65 FE) MG  tablet Take 325 mg by mouth 3 (three) times daily with meals.     Marland Kitchen ibuprofen (ADVIL,MOTRIN) 200 MG tablet Take 600 mg every 6 (six) hours as needed by mouth for headache or moderate pain.     Marland Kitchen lisinopril-hydrochlorothiazide (PRINZIDE,ZESTORETIC) 20-25 MG tablet Take 1 tablet by mouth daily. 90 tablet 3  . sertraline (ZOLOFT) 50 MG tablet Take 1 tablet (50 mg total) by mouth daily. 30 tablet 3   No current facility-administered medications on file prior to visit.    No Known Allergies  Social History:   reports that  has never smoked. she has never used smokeless tobacco. She reports that she does not drink alcohol or use drugs.  Family History  Problem Relation Age of Onset  . Heart disease Mother   . Diabetes Mother     Review of Systems: Noncontributory  PHYSICAL EXAM: Blood pressure 108/60, pulse 97, height 5' (1.524 m), weight 177 lb 3.2 oz (80.4 kg).   PHYSICAL EXAMINATION General appearance - alert, well appearing, and in no distress, oriented to person, place, and time and overweight Mental status - alert, oriented to person, place, and time, normal mood, behavior, speech, dress, motor activity, and thought processes, affect appropriate to mood Physical Examination: Chest - clear to auscultation, no wheezes,  rales or rhonchi, symmetric air entry, unlabored breathing Heart - normal rate and regular rhythm Abdomen - soft, nontender, nondistended, no masses or organomegaly  PELVIC External genitalia - normal Vulva - normal Vagina - moderate blood Cervix - 1 cm dilated raising the question of a lower uterine segment remnant from the polyp noted earlier Uterus - anterior, mildly tender    Labs: No results found for this or any previous visit (from the past 336 hour(s)).  Imaging Studies: No results found.  Assessment: 1. Probable residual endometrial polyp 2.  Abnormal uterine bleeding, with benign tissue on recent polypectomy  Patient Active Problem List    Diagnosis Date Noted  . BPPV (benign paroxysmal positional vertigo) 07/14/2016  . Numbness and tingling of right arm and leg 05/14/2016  . Acute bilateral low back pain with bilateral sciatica 05/14/2016  . Postconcussive syndrome 04/23/2016  . Anemia 04/23/2016  . Concussion with loss of consciousness   . Generalized weakness   . Spells of decreased attentiveness   . Insomnia 12/26/2015  . Precordial pain 03/19/2014  . Essential hypertension, benign 03/19/2014  . OSA (obstructive sleep apnea) 03/19/2014    Plan: 1. Patient will undergo surgical management with Hysteroscopy, D&C, removal of polyp and endometrial ablation on Thursday 07/01/2017.  2.  Preoperative blood work 06/30/2017 3. F/u post-op check 5 weeks.   .mec 06/02/2017 4:05 PM   By signing my name below, I, [name], attest that this documentation has been prepared under the direction and in the presence of [name], MD. Electronically Signed: [name], Medical Scribe. 06/02/17. 4:05 PM.  I personally performed the services described in this documentation, which was SCRIBED in my presence. The recorded information has been reviewed and considered accurate. It has been edited as necessary during review. [name], MD

## 2017-06-02 NOTE — Progress Notes (Signed)
PELVIC US TA/TV: homogeneous anteverted uterus,wnl,simple left ovarian cyst 2.5 x 1.9 x 2.2 cm,normal right ovary, EEC 18 mm w/color flow,no endometrial masses visualized,no free fluid,some discomfort during ultrasound

## 2017-06-03 NOTE — Progress Notes (Signed)
PELVIC US TA/TV:homogeneous anteverted uterus,wnl,EEC 17.7 mm,hypervascular endometrium,simple left ovarian cyst 2.5 x 1.9 x 2.2 cm,normal right ovary,images review w/Dr.Ferguson

## 2017-06-23 NOTE — Patient Instructions (Signed)
Jenny Brown  06/23/2017     @PREFPERIOPPHARMACY @   Your procedure is scheduled on  07/01/2016   Report to [address] [facility] at  800   A.M.  Call this number if you have problems the morning of surgery:  [phone]   Remember:  Do not eat food or drink liquids after midnight.  Take these medicines the morning of surgery with A SIP OF WATER  Lisinopril, zoloft.   Do not wear jewelry, make-up or nail polish.  Do not wear lotions, powders, or perfumes, or deodorant.  Do not shave 48 hours prior to surgery.  Men may shave face and neck.  Do not bring valuables to the hospital.  [facility] is not responsible for any belongings or valuables.  Contacts, dentures or bridgework may not be worn into surgery.  Leave your suitcase in the car.  After surgery it may be brought to your room.  For patients admitted to the hospital, discharge time will be determined by your treatment team.  Patients discharged the day of surgery will not be allowed to drive home.   Name and phone number of your driver:   family Special instructions:   None  Please read over the following fact sheets that you were given. Anesthesia Post-op Instructions and Care and Recovery After Surgery       Dilation and Curettage or Vacuum Curettage Dilation and curettage (D&C) and vacuum curettage are minor procedures. A D&C involves stretching (dilation) the cervix and scraping (curettage) the inside lining of the uterus (endometrium). During a D&C, tissue is gently scraped from the endometrium, starting from the top portion of the uterus down to the lowest part of the uterus (cervix). During a vacuum curettage, the lining and tissue in the uterus are removed with the use of gentle suction. Curettage may be performed to either diagnose or treat a problem. As a diagnostic procedure, curettage is performed to examine tissues from the uterus. A diagnostic curettage may be done if you have:  Irregular  bleeding in the uterus.  Bleeding with the development of clots.  Spotting between menstrual periods.  Prolonged menstrual periods or other abnormal bleeding.  Bleeding after menopause.  No menstrual period (amenorrhea).  A change in size and shape of the uterus.  Abnormal endometrial cells discovered during a Pap test.  As a treatment procedure, curettage may be performed for the following reasons:  Removal of an IUD (intrauterine device).  Removal of retained placenta after giving birth.  Abortion.  Miscarriage.  Removal of endometrial polyps.  Removal of uncommon types of noncancerous lumps (fibroids).  Tell a health care provider about:  Any allergies you have, including allergies to prescribed medicine or latex.  All medicines you are taking, including vitamins, herbs, eye drops, creams, and over-the-counter medicines. This is especially important if you take any blood-thinning medicine. Bring a list of all of your medicines to your appointment.  Any problems you or family members have had with anesthetic medicines.  Any blood disorders you have.  Any surgeries you have had.  Your medical history and any medical conditions you have.  Whether you are pregnant or may be pregnant.  Recent vaginal infections you have had.  Recent menstrual periods, bleeding problems you have had, and what form of birth control (contraception) you use. What are the risks? Generally, this is a safe procedure. However, problems may occur, including:  Infection.  Heavy vaginal bleeding.  Allergic reactions to medicines.  Damage to the cervix or other structures or organs.  Development of scar tissue (adhesions) inside the uterus, which can cause abnormal amounts of menstrual bleeding. This may make it harder to get pregnant in the future.  A hole (perforation) or puncture in the uterine wall. This is rare.  What happens before the procedure? Staying hydrated Follow  instructions from your health care provider about hydration, which may include:  Up to 2 hours before the procedure - you may continue to drink clear liquids, such as water, clear fruit juice, black coffee, and plain tea.  Eating and drinking restrictions Follow instructions from your health care provider about eating and drinking, which may include:  8 hours before the procedure - stop eating heavy meals or foods such as meat, fried foods, or fatty foods.  6 hours before the procedure - stop eating light meals or foods, such as toast or cereal.  6 hours before the procedure - stop drinking milk or drinks that contain milk.  2 hours before the procedure - stop drinking clear liquids. If your health care provider told you to take your medicine(s) on the day of your procedure, take them with only a sip of water.  Medicines  Ask your health care provider about: ? Changing or stopping your regular medicines. This is especially important if you are taking diabetes medicines or blood thinners. ? Taking medicines such as aspirin and ibuprofen. These medicines can thin your blood. Do not take these medicines before your procedure if your health care provider instructs you not to.  You may be given antibiotic medicine to help prevent infection. General instructions  For 24 hours before your procedure, do not: ? Douche. ? Use tampons. ? Use medicines, creams, or suppositories in the vagina. ? Have sexual intercourse.  You may be given a pregnancy test on the day of the procedure.  Plan to have someone take you home from the hospital or clinic.  You may have a blood or urine sample taken.  If you will be going home right after the procedure, plan to have someone with you for 24 hours. What happens during the procedure?  To reduce your risk of infection: ? Your health care team will wash or sanitize their hands. ? Your skin will be washed with soap.  An IV tube will be inserted into  one of your veins.  You will be given one of the following: ? A medicine that numbs the area in and around the cervix (local anesthetic). ? A medicine to make you fall asleep (general anesthetic).  You will lie down on your back, with your feet in foot rests (stirrups).  The size and position of your uterus will be checked.  A lubricated instrument (speculum or Sims retractor) will be inserted into the back side of your vagina. The speculum will be used to hold apart the walls of your vagina so your health care provider can see your cervix.  A tool (tenaculum) will be attached to the lip of the cervix to stabilize it.  Your cervix will be softened and dilated. This may be done by: ? Taking a medicine. ? Having tapered dilators or thin rods (laminaria) or gradual widening instruments (tapered dilators) inserted into your cervix.  A small, sharp, curved instrument (curette) will be used to scrape a small amount of tissue or cells from the endometrium or cervical canal. In some cases, gentle suction is applied with  the curette. The curette will then be removed. The cells will be taken to a lab for testing. The procedure may vary among health care providers and hospitals. What happens after the procedure?  You may have mild cramping, backache, pain, and light bleeding or spotting. You may pass small blood clots from your vagina.  You may have to wear compression stockings. These stockings help to prevent blood clots and reduce swelling in your legs.  Your blood pressure, heart rate, breathing rate, and blood oxygen level will be monitored until the medicines you were given have worn off. Summary  Dilation and curettage (D&C) involves stretching (dilation) the cervix and scraping (curettage) the inside lining of the uterus (endometrium).  After the procedure, you may have mild cramping, backache, pain, and light bleeding or spotting. You may pass small blood clots from your vagina.  Plan  to have someone take you home from the hospital or clinic. This information is not intended to replace advice given to you by your health care provider. Make sure you discuss any questions you have with your health care provider. Document Released: 06/15/2005 Document Revised: 03/01/2016 Document Reviewed: 03/01/2016 Elsevier Interactive Patient Education  2018 [employer].  Dilation and Curettage or Vacuum Curettage, Care After These instructions give you information about caring for yourself after your procedure. Your doctor may also give you more specific instructions. Call your doctor if you have any problems or questions after your procedure. Follow these instructions at home: Activity  Do not drive or use heavy machinery while taking prescription pain medicine.  For 24 hours after your procedure, avoid driving.  Take short walks often, followed by rest periods. Ask your doctor what activities are safe for you. After one or two days, you may be able to return to your normal activities.  Do not lift anything that is heavier than 10 lb (4.5 kg) until your doctor approves.  For at least 2 weeks, or as long as told by your doctor: ? Do not douche. ? Do not use tampons. ? Do not have sex. General instructions  Take over-the-counter and prescription medicines only as told by your doctor. This is very important if you take blood thinning medicine.  Do not take baths, swim, or use a hot tub until your doctor approves. Take showers instead of baths.  Wear compression stockings as told by your doctor.  It is up to you to get the results of your procedure. Ask your doctor when your results will be ready.  Keep all follow-up visits as told by your doctor. This is important. Contact a doctor if:  You have very bad cramps that get worse or do not get better with medicine.  You have very bad pain in your belly (abdomen).  You cannot drink fluids without throwing up (vomiting).  You  get pain in a different part of the area between your belly and thighs (pelvis).  You have bad-smelling discharge from your vagina.  You have a rash. Get help right away if:  You are bleeding a lot from your vagina. A lot of bleeding means soaking more than one sanitary pad in an hour, for 2 hours in a row.  You have clumps of blood (blood clots) coming from your vagina.  You have a fever or chills.  Your belly feels very tender or hard.  You have chest pain.  You have trouble breathing.  You cough up blood.  You feel dizzy.  You feel light-headed.  You pass  out (faint).  You have pain in your neck or shoulder area. Summary  Take short walks often, followed by rest periods. Ask your doctor what activities are safe for you. After one or two days, you may be able to return to your normal activities.  Do not lift anything that is heavier than 10 lb (4.5 kg) until your doctor approves.  Do not take baths, swim, or use a hot tub until your doctor approves. Take showers instead of baths.  Contact your doctor if you have any symptoms of infection, like bad-smelling discharge from your vagina. This information is not intended to replace advice given to you by your health care provider. Make sure you discuss any questions you have with your health care provider. Document Released: 03/24/2008 Document Revised: 03/02/2016 Document Reviewed: 03/02/2016 Elsevier Interactive Patient Education  2017 [place]. Hysteroscopy Hysteroscopy is a procedure used for looking inside the womb (uterus). It may be done for various reasons, including:  To evaluate abnormal bleeding, fibroid (benign, noncancerous) tumors, polyps, scar tissue (adhesions), and possibly cancer of the uterus.  To look for lumps (tumors) and other uterine growths.  To look for causes of why a woman cannot get pregnant (infertility), causes of recurrent loss of pregnancy (miscarriages), or a lost intrauterine device  (IUD).  To perform a sterilization by blocking the fallopian tubes from inside the uterus.  In this procedure, a thin, flexible tube with a tiny light and camera on the end of it (hysteroscope) is used to look inside the uterus. A hysteroscopy should be done right after a menstrual period to be sure you are not pregnant. LET [facility] CARE PROVIDER KNOW ABOUT:  Any allergies you have.  All medicines you are taking, including vitamins, herbs, eye drops, creams, and over-the-counter medicines.  Previous problems you or members of your family have had with the use of anesthetics.  Any blood disorders you have.  Previous surgeries you have had.  Medical conditions you have. RISKS AND COMPLICATIONS Generally, this is a safe procedure. However, as with any procedure, complications can occur. Possible complications include:  Putting a hole in the uterus.  Excessive bleeding.  Infection.  Damage to the cervix.  Injury to other organs.  Allergic reaction to medicines.  Too much fluid used in the uterus for the procedure.  BEFORE THE PROCEDURE  Ask your health care provider about changing or stopping any regular medicines.  Do not take aspirin or blood thinners for 1 week before the procedure, or as directed by your health care provider. These can cause bleeding.  If you smoke, do not smoke for 2 weeks before the procedure.  In some cases, a medicine is placed in the cervix the day before the procedure. This medicine makes the cervix have a larger opening (dilate). This makes it easier for the instrument to be inserted into the uterus during the procedure.  Do not eat or drink anything for at least 8 hours before the surgery.  Arrange for someone to take you home after the procedure. PROCEDURE  You may be given a medicine to relax you (sedative). You may also be given one of the following: ? A medicine that numbs the area around the cervix (local anesthetic). ? A medicine  that makes you sleep through the procedure (general anesthetic).  The hysteroscope is inserted through the vagina into the uterus. The camera on the hysteroscope sends a picture to a TV screen. This gives the surgeon a good view inside the  uterus.  During the procedure, air or a liquid is put into the uterus, which allows the surgeon to see better.  Sometimes, tissue is gently scraped from inside the uterus. These tissue samples are sent to a lab for testing. What to expect after the procedure  If you had a general anesthetic, you may be groggy for a couple hours after the procedure.  If you had a local anesthetic, you will be able to go home as soon as you are stable and feel ready.  You may have some cramping. This normally lasts for a couple days.  You may have bleeding, which varies from light spotting for a few days to menstrual-like bleeding for 3-7 days. This is normal.  If your test results are not back during the visit, make an appointment with your health care provider to find out the results. This information is not intended to replace advice given to you by your health care provider. Make sure you discuss any questions you have with your health care provider. Document Released: 09/21/2000 Document Revised: 11/21/2015 Document Reviewed: 01/12/2013 Elsevier Interactive Patient Education  2017 [place]. Hysteroscopy, Care After Refer to this sheet in the next few weeks. These instructions provide you with information on caring for yourself after your procedure. Your health care provider may also give you more specific instructions. Your treatment has been planned according to current medical practices, but problems sometimes occur. Call your health care provider if you have any problems or questions after your procedure. What can I expect after the procedure? After your procedure, it is typical to have the following:  You may have some cramping. This normally lasts for a  couple days.  You may have bleeding. This can vary from light spotting for a few days to menstrual-like bleeding for 3-7 days.  Follow these instructions at home:  Rest for the first 1-2 days after the procedure.  Only take over-the-counter or prescription medicines as directed by your health care provider. Do not take aspirin. It can increase the chances of bleeding.  Take showers instead of baths for 2 weeks or as directed by your health care provider.  Do not drive for 24 hours or as directed.  Do not drink alcohol while taking pain medicine.  Do not use tampons, douche, or have sexual intercourse for 2 weeks or until your health care provider says it is okay.  Take your temperature twice a day for 4-5 days. Write it down each time.  Follow your health care provider's advice about diet, exercise, and lifting.  If you develop constipation, you may: ? Take a mild laxative if your health care provider approves. ? Add bran foods to your diet. ? Drink enough fluids to keep your urine clear or pale yellow.  Try to have someone with you or available to you for the first 24-48 hours, especially if you were given a general anesthetic.  Follow up with your health care provider as directed. Contact a health care provider if:  You feel dizzy or lightheaded.  You feel sick to your stomach (nauseous).  You have abnormal vaginal discharge.  You have a rash.  You have pain that is not controlled with medicine. Get help right away if:  You have bleeding that is heavier than a normal menstrual period.  You have a fever.  You have increasing cramps or pain, not controlled with medicine.  You have new belly (abdominal) pain.  You pass out.  You have pain in the  tops of your shoulders (shoulder strap areas).  You have shortness of breath. This information is not intended to replace advice given to you by your health care provider. Make sure you discuss any questions you have  with your health care provider. Document Released: 04/05/2013 Document Revised: 11/21/2015 Document Reviewed: 01/12/2013 Elsevier Interactive Patient Education  2017 [place].  Endometrial Ablation Endometrial ablation is a procedure that destroys the thin inner layer of the lining of the uterus (endometrium). This procedure may be done:  To stop heavy periods.  To stop bleeding that is causing anemia.  To control irregular bleeding.  To treat bleeding caused by small tumors (fibroids) in the endometrium.  This procedure is often an alternative to major surgery, such as removal of the uterus and cervix (hysterectomy). As a result of this procedure:  You may not be able to have children. However, if you are premenopausal (you have not gone through menopause): ? You may still have a small chance of getting pregnant. ? You will need to use a reliable method of birth control after the procedure to prevent pregnancy.  You may stop having a menstrual period, or you may have only a small amount of bleeding during your period. Menstruation may return several years after the procedure.  Tell a health care provider about:  Any allergies you have.  All medicines you are taking, including vitamins, herbs, eye drops, creams, and over-the-counter medicines.  Any problems you or family members have had with the use of anesthetic medicines.  Any blood disorders you have.  Any surgeries you have had.  Any medical conditions you have. What are the risks? Generally, this is a safe procedure. However, problems may occur, including:  A hole (perforation) in the uterus or bowel.  Infection of the uterus, bladder, or vagina.  Bleeding.  Damage to other structures or organs.  An air bubble in the lung (air embolus).  Problems with pregnancy after the procedure.  Failure of the procedure.  Decreased ability to diagnose cancer in the endometrium.  What happens before the  procedure?  You will have tests of your endometrium to make sure there are no pre-cancerous cells or cancer cells present.  You may have an ultrasound of the uterus.  You may be given medicines to thin the endometrium.  Ask your health care provider about: ? Changing or stopping your regular medicines. This is especially important if you take diabetes medicines or blood thinners. ? Taking medicines such as aspirin and ibuprofen. These medicines can thin your blood. Do not take these medicines before your procedure if your doctor tells you not to.  Plan to have someone take you home from the hospital or clinic. What happens during the procedure?  You will lie on an exam table with your feet and legs supported as in a pelvic exam.  To lower your risk of infection: ? Your health care team will wash or sanitize their hands and put on germ-free (sterile) gloves. ? Your genital area will be washed with soap.  An IV tube will be inserted into one of your veins.  You will be given a medicine to help you relax (sedative).  A surgical instrument with a light and camera (resectoscope) will be inserted into your vagina and moved into your uterus. This allows your surgeon to see inside your uterus.  Endometrial tissue will be removed using one of the following methods: ? Radiofrequency. This method uses a radiofrequency-alternating electric current to remove the endometrium. ?  Cryotherapy. This method uses extreme cold to freeze the endometrium. ? Heated-free liquid. This method uses a heated saltwater (saline) solution to remove the endometrium. ? Microwave. This method uses high-energy microwaves to heat up the endometrium and remove it. ? Thermal balloon. This method involves inserting a catheter with a balloon tip into the uterus. The balloon tip is filled with heated fluid to remove the endometrium. The procedure may vary among health care providers and hospitals. What happens after the  procedure?  Your blood pressure, heart rate, breathing rate, and blood oxygen level will be monitored until the medicines you were given have worn off.  As tissue healing occurs, you may notice vaginal bleeding for 4-6 weeks after the procedure. You may also experience: ? Cramps. ? Thin, watery vaginal discharge that is light pink or Iannacone in color. ? A need to urinate more frequently than usual. ? Nausea.  Do not drive for 24 hours if you were given a sedative.  Do not have sex or insert anything into your vagina until your health care provider approves. Summary  Endometrial ablation is done to treat the many causes of heavy menstrual bleeding.  The procedure may be done only after medications have been tried to control the bleeding.  Plan to have someone take you home from the hospital or clinic. This information is not intended to replace advice given to you by your health care provider. Make sure you discuss any questions you have with your health care provider. Document Released: 04/24/2004 Document Revised: 07/02/2016 Document Reviewed: 07/02/2016 Elsevier Interactive Patient Education  2017 [place] Anesthesia, Adult General anesthesia is the use of medicines to make a person "go to sleep" (be unconscious) for a medical procedure. General anesthesia is often recommended when a procedure:  Is long.  Requires you to be still or in an unusual position.  Is major and can cause you to lose blood.  Is impossible to do without general anesthesia.  The medicines used for general anesthesia are called general anesthetics. In addition to making you sleep, the medicines:  Prevent pain.  Control your blood pressure.  Relax your muscles.  Tell a health care provider about:  Any allergies you have.  All medicines you are taking, including vitamins, herbs, eye drops, creams, and over-the-counter medicines.  Any problems you or family members have had with  anesthetic medicines.  Types of anesthetics you have had in the past.  Any bleeding disorders you have.  Any surgeries you have had.  Any medical conditions you have.  Any history of heart or lung conditions, such as heart failure, sleep apnea, or chronic obstructive pulmonary disease (COPD).  Whether you are pregnant or may be pregnant.  Whether you use tobacco, alcohol, marijuana, or street drugs.  Any history of Armed forces logistics/support/administrative officer.  Any history of depression or anxiety. What are the risks? Generally, this is a safe procedure. However, problems may occur, including:  Allergic reaction to anesthetics.  Lung and heart problems.  Inhaling food or liquids from your stomach into your lungs (aspiration).  Injury to nerves.  Waking up during your procedure and being unable to move (rare).  Extreme agitation or a state of mental confusion (delirium) when you wake up from the anesthetic.  Air in the bloodstream, which can lead to stroke.  These problems are more likely to develop if you are having a major surgery or if you have an advanced medical condition. You can prevent some of these complications by  answering all of your health care provider's questions thoroughly and by following all pre-procedure instructions. General anesthesia can cause side effects, including:  Nausea or vomiting  A sore throat from the breathing tube.  Feeling cold or shivery.  Feeling tired, washed out, or achy.  Sleepiness or drowsiness.  Confusion or agitation.  What happens before the procedure? Staying hydrated Follow instructions from your health care provider about hydration, which may include:  Up to 2 hours before the procedure - you may continue to drink clear liquids, such as water, clear fruit juice, black coffee, and plain tea.  Eating and drinking restrictions Follow instructions from your health care provider about eating and drinking, which may include:  8 hours before the  procedure - stop eating heavy meals or foods such as meat, fried foods, or fatty foods.  6 hours before the procedure - stop eating light meals or foods, such as toast or cereal.  6 hours before the procedure - stop drinking milk or drinks that contain milk.  2 hours before the procedure - stop drinking clear liquids.  Medicines  Ask your health care provider about: ? Changing or stopping your regular medicines. This is especially important if you are taking diabetes medicines or blood thinners. ? Taking medicines such as aspirin and ibuprofen. These medicines can thin your blood. Do not take these medicines before your procedure if your health care provider instructs you not to. ? Taking new dietary supplements or medicines. Do not take these during the week before your procedure unless your health care provider approves them.  If you are told to take a medicine or to continue taking a medicine on the day of the procedure, take the medicine with sips of water. General instructions   Ask if you will be going home the same day, the following day, or after a longer hospital stay. ? Plan to have someone take you home. ? Plan to have someone stay with you for the first 24 hours after you leave the hospital or clinic.  For 3-6 weeks before the procedure, try not to use any tobacco products, such as cigarettes, chewing tobacco, and e-cigarettes.  You may brush your teeth on the morning of the procedure, but make sure to spit out the toothpaste. What happens during the procedure?  You will be given anesthetics through a mask and through an IV tube in one of your veins.  You may receive medicine to help you relax (sedative).  As soon as you are asleep, a breathing tube may be used to help you breathe.  An anesthesia specialist will stay with you throughout the procedure. He or she will help keep you comfortable and safe by continuing to give you medicines and adjusting the amount of  medicine that you get. He or she will also watch your blood pressure, pulse, and oxygen levels to make sure that the anesthetics do not cause any problems.  If a breathing tube was used to help you breathe, it will be removed before you wake up. The procedure may vary among health care providers and hospitals. What happens after the procedure?  You will wake up, often slowly, after the procedure is complete, usually in a recovery area.  Your blood pressure, heart rate, breathing rate, and blood oxygen level will be monitored until the medicines you were given have worn off.  You may be given medicine to help you calm down if you feel anxious or agitated.  If you will be going  home the same day, your health care provider may check to make sure you can stand, drink, and urinate.  Your health care providers will treat your pain and side effects before you go home.  Do not drive for 24 hours if you received a sedative.  You may: ? Feel nauseous and vomit. ? Have a sore throat. ? Have mental slowness. ? Feel cold or shivery. ? Feel sleepy. ? Feel tired. ? Feel sore or achy, even in parts of your body where you did not have surgery. This information is not intended to replace advice given to you by your health care provider. Make sure you discuss any questions you have with your health care provider. Document Released: 09/22/2007 Document Revised: 11/26/2015 Document Reviewed: 05/30/2015 Elsevier Interactive Patient Education  2018 [place] Anesthesia, Adult, Care After These instructions provide you with information about caring for yourself after your procedure. Your health care provider may also give you more specific instructions. Your treatment has been planned according to current medical practices, but problems sometimes occur. Call your health care provider if you have any problems or questions after your procedure. What can I expect after the procedure? After the  procedure, it is common to have:  Vomiting.  A sore throat.  Mental slowness.  It is common to feel:  Nauseous.  Cold or shivery.  Sleepy.  Tired.  Sore or achy, even in parts of your body where you did not have surgery.  Follow these instructions at home: For at least 24 hours after the procedure:  Do not: ? Participate in activities where you could fall or become injured. ? Drive. ? Use heavy machinery. ? Drink alcohol. ? Take sleeping pills or medicines that cause drowsiness. ? Make important decisions or sign legal documents. ? Take care of children on your own.  Rest. Eating and drinking  If you vomit, drink water, juice, or soup when you can drink without vomiting.  Drink enough fluid to keep your urine clear or pale yellow.  Make sure you have little or no nausea before eating solid foods.  Follow the diet recommended by your health care provider. General instructions  Have a responsible adult stay with you until you are awake and alert.  Return to your normal activities as told by your health care provider. Ask your health care provider what activities are safe for you.  Take over-the-counter and prescription medicines only as told by your health care provider.  If you smoke, do not smoke without supervision.  Keep all follow-up visits as told by your health care provider. This is important. Contact a health care provider if:  You continue to have nausea or vomiting at home, and medicines are not helpful.  You cannot drink fluids or start eating again.  You cannot urinate after 8-12 hours.  You develop a skin rash.  You have fever.  You have increasing redness at the site of your procedure. Get help right away if:  You have difficulty breathing.  You have chest pain.  You have unexpected bleeding.  You feel that you are having a life-threatening or urgent problem. This information is not intended to replace advice given to you by your  health care provider. Make sure you discuss any questions you have with your health care provider. Document Released: 09/21/2000 Document Revised: 11/18/2015 Document Reviewed: 05/30/2015 Elsevier Interactive Patient Education  [employer].

## 2017-06-24 ENCOUNTER — Other Ambulatory Visit: Payer: Self-pay | Admitting: Obstetrics and Gynecology

## 2017-06-28 ENCOUNTER — Encounter (HOSPITAL_COMMUNITY): Payer: Self-pay

## 2017-06-28 ENCOUNTER — Other Ambulatory Visit: Payer: Self-pay

## 2017-06-28 ENCOUNTER — Encounter (HOSPITAL_COMMUNITY)
Admission: RE | Admit: 2017-06-28 | Discharge: 2017-06-28 | Disposition: A | Discharge status: Home / Self Care | Payer: BC Managed Care – PPO | Source: Ambulatory Visit | Attending: Obstetrics and Gynecology | Admitting: Obstetrics and Gynecology

## 2017-06-28 DIAGNOSIS — G2581 Restless legs syndrome: Secondary | ICD-10-CM | POA: Diagnosis not present

## 2017-06-28 DIAGNOSIS — G4700 Insomnia, unspecified: Secondary | ICD-10-CM | POA: Diagnosis not present

## 2017-06-28 DIAGNOSIS — I10 Essential (primary) hypertension: Secondary | ICD-10-CM | POA: Diagnosis not present

## 2017-06-28 DIAGNOSIS — N840 Polyp of corpus uteri: Secondary | ICD-10-CM | POA: Diagnosis not present

## 2017-06-28 DIAGNOSIS — D649 Anemia, unspecified: Secondary | ICD-10-CM | POA: Diagnosis not present

## 2017-06-28 DIAGNOSIS — Z79899 Other long term (current) drug therapy: Secondary | ICD-10-CM | POA: Diagnosis not present

## 2017-06-28 DIAGNOSIS — N939 Abnormal uterine and vaginal bleeding, unspecified: Secondary | ICD-10-CM | POA: Diagnosis present

## 2017-06-28 DIAGNOSIS — G4733 Obstructive sleep apnea (adult) (pediatric): Secondary | ICD-10-CM | POA: Diagnosis not present

## 2017-06-28 LAB — COMPREHENSIVE METABOLIC PANEL
ALBUMIN: 4.1 g/dL (ref 3.5–5.0)
ALK PHOS: 57 U/L (ref 38–126)
ALT: 25 U/L (ref 14–54)
AST: 28 U/L (ref 15–41)
Anion gap: 13 (ref 5–15)
BILIRUBIN TOTAL: 0.4 mg/dL (ref 0.3–1.2)
BUN: 20 mg/dL (ref 6–20)
CO2: 21 mmol/L — ABNORMAL LOW (ref 22–32)
CREATININE: 0.84 mg/dL (ref 0.44–1.00)
Calcium: 9.3 mg/dL (ref 8.9–10.3)
Chloride: 104 mmol/L (ref 101–111)
GFR calc Af Amer: >60 mL/min (ref >60)
GLUCOSE: 118 mg/dL — AB (ref 65–99)
Potassium: 3.5 mmol/L (ref 3.5–5.1)
Sodium: 138 mmol/L (ref 135–145)
TOTAL PROTEIN: 8 g/dL (ref 6.5–8.1)

## 2017-06-28 LAB — CBC
HEMATOCRIT: 32.1 % — AB (ref 36.0–46.0)
HEMOGLOBIN: 9.1 g/dL — AB (ref 12.0–15.0)
MCH: 17.4 pg — ABNORMAL LOW (ref 26.0–34.0)
MCHC: 28.3 g/dL — ABNORMAL LOW (ref 30.0–36.0)
MCV: 61.4 fL — AB (ref 78.0–100.0)
Platelets: 398 10*3/uL (ref 150–400)
RBC: 5.23 MIL/uL — AB (ref 3.87–5.11)
RDW: 17.6 % — ABNORMAL HIGH (ref 11.5–15.5)
WBC: 9.3 10*3/uL (ref 4.0–10.5)

## 2017-06-28 LAB — HCG, SERUM, QUALITATIVE: PREG SERUM: NEGATIVE

## 2017-06-29 LAB — TYPE AND SCREEN
ABO/RH(D): A NEG
Antibody Screen: NEGATIVE

## 2017-06-30 NOTE — Pre-Procedure Instructions (Signed)
Labs reviewed with Dr Patsey Berthold. No further orders given.

## 2017-07-01 ENCOUNTER — Telehealth: Payer: Self-pay | Admitting: Obstetrics and Gynecology

## 2017-07-01 ENCOUNTER — Ambulatory Visit (HOSPITAL_COMMUNITY)
Admission: RE | Admit: 2017-07-01 | Discharge: 2017-07-01 | Disposition: A | Discharge status: Home / Self Care | Payer: BC Managed Care – PPO | Source: Ambulatory Visit | Attending: Obstetrics and Gynecology | Admitting: Obstetrics and Gynecology

## 2017-07-01 ENCOUNTER — Encounter (HOSPITAL_COMMUNITY): Payer: Self-pay | Admitting: *Deleted

## 2017-07-01 ENCOUNTER — Encounter (HOSPITAL_COMMUNITY)
Admission: RE | Disposition: A | Discharge status: Home / Self Care | Payer: Self-pay | Source: Ambulatory Visit | Attending: Obstetrics and Gynecology

## 2017-07-01 ENCOUNTER — Ambulatory Visit (HOSPITAL_COMMUNITY): Payer: BC Managed Care – PPO | Admitting: Anesthesiology

## 2017-07-01 DIAGNOSIS — I10 Essential (primary) hypertension: Secondary | ICD-10-CM | POA: Insufficient documentation

## 2017-07-01 DIAGNOSIS — N939 Abnormal uterine and vaginal bleeding, unspecified: Secondary | ICD-10-CM | POA: Diagnosis not present

## 2017-07-01 DIAGNOSIS — N816 Rectocele: Secondary | ICD-10-CM | POA: Insufficient documentation

## 2017-07-01 DIAGNOSIS — Z79899 Other long term (current) drug therapy: Secondary | ICD-10-CM | POA: Insufficient documentation

## 2017-07-01 DIAGNOSIS — D649 Anemia, unspecified: Secondary | ICD-10-CM | POA: Diagnosis present

## 2017-07-01 DIAGNOSIS — G2581 Restless legs syndrome: Secondary | ICD-10-CM | POA: Insufficient documentation

## 2017-07-01 DIAGNOSIS — G4733 Obstructive sleep apnea (adult) (pediatric): Secondary | ICD-10-CM | POA: Insufficient documentation

## 2017-07-01 DIAGNOSIS — N840 Polyp of corpus uteri: Secondary | ICD-10-CM | POA: Insufficient documentation

## 2017-07-01 DIAGNOSIS — G4700 Insomnia, unspecified: Secondary | ICD-10-CM | POA: Insufficient documentation

## 2017-07-01 DIAGNOSIS — D500 Iron deficiency anemia secondary to blood loss (chronic): Secondary | ICD-10-CM | POA: Diagnosis not present

## 2017-07-01 HISTORY — PX: DILITATION & CURRETTAGE/HYSTROSCOPY WITH NOVASURE ABLATION: SHX5568

## 2017-07-01 SURGERY — DILATATION & CURETTAGE/HYSTEROSCOPY WITH NOVASURE ABLATION
Anesthesia: General

## 2017-07-01 MED ORDER — ROCURONIUM BROMIDE 50 MG/5ML IV SOLN
INTRAVENOUS | Status: AC
  Filled 2017-07-01: qty 1

## 2017-07-01 MED ORDER — SODIUM CHLORIDE 0.9 % IJ SOLN
INTRAMUSCULAR | Status: AC
  Filled 2017-07-01: qty 10

## 2017-07-01 MED ORDER — FENTANYL CITRATE (PF) 100 MCG/2ML IJ SOLN
25.0000 ug | INTRAMUSCULAR | Status: DC | PRN
  Administered 2017-07-01 (×2): 50 ug via INTRAVENOUS

## 2017-07-01 MED ORDER — DEXAMETHASONE SODIUM PHOSPHATE 4 MG/ML IJ SOLN
INTRAMUSCULAR | Status: AC
  Filled 2017-07-01: qty 1

## 2017-07-01 MED ORDER — MIDAZOLAM HCL 2 MG/2ML IJ SOLN
INTRAMUSCULAR | Status: AC
  Filled 2017-07-01: qty 2

## 2017-07-01 MED ORDER — ONDANSETRON HCL 4 MG/2ML IJ SOLN
4.0000 mg | Freq: Once | INTRAMUSCULAR | Status: AC
  Administered 2017-07-01: 4 mg via INTRAVENOUS

## 2017-07-01 MED ORDER — EPHEDRINE SULFATE 50 MG/ML IJ SOLN
INTRAMUSCULAR | Status: AC
  Filled 2017-07-01: qty 1

## 2017-07-01 MED ORDER — FENTANYL CITRATE (PF) 100 MCG/2ML IJ SOLN
INTRAMUSCULAR | Status: DC | PRN
  Administered 2017-07-01: 50 ug via INTRAVENOUS
  Administered 2017-07-01: 25 ug via INTRAVENOUS

## 2017-07-01 MED ORDER — KETOROLAC TROMETHAMINE 10 MG PO TABS: 10.0000 mg | ORAL_TABLET | Freq: Four times a day (QID) | ORAL | 0 refills | Status: DC | PRN

## 2017-07-01 MED ORDER — FENTANYL CITRATE (PF) 100 MCG/2ML IJ SOLN
INTRAMUSCULAR | Status: AC
  Filled 2017-07-01: qty 2

## 2017-07-01 MED ORDER — LIDOCAINE HCL (CARDIAC) 20 MG/ML IV SOLN
INTRAVENOUS | Status: DC | PRN
  Administered 2017-07-01: 40 mg via INTRAVENOUS

## 2017-07-01 MED ORDER — SODIUM CHLORIDE 0.9 % IR SOLN
Status: DC | PRN
  Administered 2017-07-01: 3000 mL

## 2017-07-01 MED ORDER — LACTATED RINGERS IV SOLN
INTRAVENOUS | Status: DC
  Administered 2017-07-01: 09:00:00 via INTRAVENOUS

## 2017-07-01 MED ORDER — ONDANSETRON HCL 4 MG/2ML IJ SOLN
INTRAMUSCULAR | Status: AC
  Filled 2017-07-01: qty 2

## 2017-07-01 MED ORDER — FENTANYL CITRATE (PF) 250 MCG/5ML IJ SOLN
INTRAMUSCULAR | Status: AC
  Filled 2017-07-01: qty 5

## 2017-07-01 MED ORDER — PHENYLEPHRINE 40 MCG/ML (10ML) SYRINGE FOR IV PUSH (FOR BLOOD PRESSURE SUPPORT)
PREFILLED_SYRINGE | INTRAVENOUS | Status: AC
  Filled 2017-07-01: qty 10

## 2017-07-01 MED ORDER — BUPIVACAINE-EPINEPHRINE (PF) 0.5% -1:200000 IJ SOLN
INTRAMUSCULAR | Status: AC
  Filled 2017-07-01: qty 30

## 2017-07-01 MED ORDER — LIDOCAINE HCL (PF) 1 % IJ SOLN
INTRAMUSCULAR | Status: AC
  Filled 2017-07-01: qty 5

## 2017-07-01 MED ORDER — SUCCINYLCHOLINE CHLORIDE 20 MG/ML IJ SOLN
INTRAMUSCULAR | Status: AC
  Filled 2017-07-01: qty 1

## 2017-07-01 MED ORDER — CEFAZOLIN SODIUM-DEXTROSE 2-4 GM/100ML-% IV SOLN
2.0000 g | INTRAVENOUS | Status: AC
  Administered 2017-07-01: 2 g via INTRAVENOUS
  Filled 2017-07-01: qty 100

## 2017-07-01 MED ORDER — IBUPROFEN 200 MG PO TABS: 400.0000 mg | ORAL_TABLET | Freq: Three times a day (TID) | ORAL | 0 refills | Status: DC | PRN

## 2017-07-01 MED ORDER — PROPOFOL 10 MG/ML IV BOLUS
INTRAVENOUS | Status: DC | PRN
  Administered 2017-07-01: 200 mg via INTRAVENOUS
  Administered 2017-07-01: 40 mg via INTRAVENOUS

## 2017-07-01 MED ORDER — MIDAZOLAM HCL 2 MG/2ML IJ SOLN
1.0000 mg | INTRAMUSCULAR | Status: AC
  Administered 2017-07-01 (×2): 2 mg via INTRAVENOUS
  Filled 2017-07-01: qty 2

## 2017-07-01 MED ORDER — DEXAMETHASONE SODIUM PHOSPHATE 4 MG/ML IJ SOLN
4.0000 mg | INTRAMUSCULAR | Status: AC
  Administered 2017-07-01: 4 mg via INTRAVENOUS

## 2017-07-01 MED ORDER — PROPOFOL 10 MG/ML IV BOLUS
INTRAVENOUS | Status: AC
  Filled 2017-07-01: qty 40

## 2017-07-01 MED ORDER — BUPIVACAINE-EPINEPHRINE (PF) 0.5% -1:200000 IJ SOLN
INTRAMUSCULAR | Status: DC | PRN
  Administered 2017-07-01: 19 mL via PERINEURAL

## 2017-07-01 MED ORDER — TRAMADOL HCL 50 MG PO TABS: 50.0000 mg | ORAL_TABLET | Freq: Four times a day (QID) | ORAL | 0 refills | Status: DC | PRN

## 2017-07-01 MED ORDER — 0.9 % SODIUM CHLORIDE (POUR BTL) OPTIME
TOPICAL | Status: DC | PRN
  Administered 2017-07-01: 1000 mL

## 2017-07-01 SURGICAL SUPPLY — 24 items
ABLATOR ENDOMETRIAL BIPOLAR (ABLATOR) ×2 IMPLANT
BAG HAMPER (MISCELLANEOUS) ×2 IMPLANT
CLOTH BEACON ORANGE TIMEOUT ST (SAFETY) ×2 IMPLANT
COVER LIGHT HANDLE STERIS (MISCELLANEOUS) ×4 IMPLANT
DECANTER SPIKE VIAL GLASS SM (MISCELLANEOUS) ×2 IMPLANT
GLOVE BIOGEL PI IND STRL 7.0 (GLOVE) ×1 IMPLANT
GLOVE BIOGEL PI IND STRL 9 (GLOVE) ×1 IMPLANT
GLOVE BIOGEL PI INDICATOR 7.0 (GLOVE) ×1
GLOVE BIOGEL PI INDICATOR 9 (GLOVE) ×1
GLOVE ECLIPSE 9.0 STRL (GLOVE) ×2 IMPLANT
GOWN SPEC L3 XXLG W/TWL (GOWN DISPOSABLE) ×2 IMPLANT
GOWN STRL REUS W/TWL LRG LVL3 (GOWN DISPOSABLE) ×2 IMPLANT
INST SET HYSTEROSCOPY (KITS) ×2 IMPLANT
IV NS IRRIG 3000ML ARTHROMATIC (IV SOLUTION) ×2 IMPLANT
KIT ROOM TURNOVER AP CYSTO (KITS) ×2 IMPLANT
KIT ROOM TURNOVER APOR (KITS) ×2 IMPLANT
MANIFOLD NEPTUNE II (INSTRUMENTS) ×2 IMPLANT
NS IRRIG 1000ML POUR BTL (IV SOLUTION) ×2 IMPLANT
PACK PERI GYN (CUSTOM PROCEDURE TRAY) ×2 IMPLANT
PAD ARMBOARD 7.5X6 YLW CONV (MISCELLANEOUS) ×2 IMPLANT
PAD TELFA 3X4 1S STER (GAUZE/BANDAGES/DRESSINGS) ×2 IMPLANT
SET BASIN LINEN APH (SET/KITS/TRAYS/PACK) ×2 IMPLANT
SET IRRIG Y TYPE TUR BLADDER L (SET/KITS/TRAYS/PACK) ×2 IMPLANT
SYR CONTROL 10ML LL (SYRINGE) ×2 IMPLANT

## 2017-07-01 NOTE — Telephone Encounter (Signed)
Patient called stating that she just had surgery with Dr. Glo Herring and she would like to know if Dr.Ferguson could call her in a medication. I let patient know that Dr. Glo Herring is not in the office. Please contact pt

## 2017-07-01 NOTE — Op Note (Signed)
Please see the brief operative note for surgical details 

## 2017-07-01 NOTE — Anesthesia Postprocedure Evaluation (Signed)
Anesthesia Post Note  Patient: Jenny Brown  Procedure(s) Performed: DILATATION & CURETTAGE/HYSTEROSCOPY WITH NOVASURE ABLATION (N/A )  Patient location during evaluation: Other (Phase 2) Anesthesia Type: General Level of consciousness: awake and alert, oriented and patient cooperative Pain management: pain level controlled Vital Signs Assessment: post-procedure vital signs reviewed and stable Respiratory status: spontaneous breathing and respiratory function stable Cardiovascular status: stable Postop Assessment: no apparent nausea or vomiting Anesthetic complications: no     Last Vitals:  Vitals:   07/01/17 1215 07/01/17 1230  BP: 131/88 129/85  Pulse: 80 79  Resp: 13 16  Temp:  36.6 C  SpO2: 96% 96%    Last Pain:  Vitals:   07/01/17 1230  TempSrc: Oral  PainSc:                  Yahayra Geis A

## 2017-07-01 NOTE — Discharge Instructions (Signed)
Endometrial Ablation Endometrial ablation is a procedure that destroys the thin inner layer of the lining of the uterus (endometrium). This procedure may be done:  To stop heavy periods.  To stop bleeding that is causing anemia.  To control irregular bleeding.  To treat bleeding caused by small tumors (fibroids) in the endometrium.  This procedure is often an alternative to major surgery, such as removal of the uterus and cervix (hysterectomy). As a result of this procedure:  You may not be able to have children. However, if you are premenopausal (you have not gone through menopause): ? You may still have a small chance of getting pregnant. ? You will need to use a reliable method of birth control after the procedure to prevent pregnancy.  You may stop having a menstrual period, or you may have only a small amount of bleeding during your period. Menstruation may return several years after the procedure.  Tell a health care provider about:  Any allergies you have.  All medicines you are taking, including vitamins, herbs, eye drops, creams, and over-the-counter medicines.  Any problems you or family members have had with the use of anesthetic medicines.  Any blood disorders you have.  Any surgeries you have had.  Any medical conditions you have. What are the risks? Generally, this is a safe procedure. However, problems may occur, including:  A hole (perforation) in the uterus or bowel.  Infection of the uterus, bladder, or vagina.  Bleeding.  Damage to other structures or organs.  An air bubble in the lung (air embolus).  Problems with pregnancy after the procedure.  Failure of the procedure.  Decreased ability to diagnose cancer in the endometrium.  What happens before the procedure?  You will have tests of your endometrium to make sure there are no pre-cancerous cells or cancer cells present.  You may have an ultrasound of the uterus.  You may be given  medicines to thin the endometrium.  Ask your health care provider about: ? Changing or stopping your regular medicines. This is especially important if you take diabetes medicines or blood thinners. ? Taking medicines such as aspirin and ibuprofen. These medicines can thin your blood. Do not take these medicines before your procedure if your doctor tells you not to.  Plan to have someone take you home from the hospital or clinic. What happens during the procedure?  You will lie on an exam table with your feet and legs supported as in a pelvic exam.  To lower your risk of infection: ? Your health care team will wash or sanitize their hands and put on germ-free (sterile) gloves. ? Your genital area will be washed with soap.  An IV tube will be inserted into one of your veins.  You will be given a medicine to help you relax (sedative).  A surgical instrument with a light and camera (resectoscope) will be inserted into your vagina and moved into your uterus. This allows your surgeon to see inside your uterus.  Endometrial tissue will be removed using one of the following methods: ? Radiofrequency. This method uses a radiofrequency-alternating electric current to remove the endometrium. ? Cryotherapy. This method uses extreme cold to freeze the endometrium. ? Heated-free liquid. This method uses a heated saltwater (saline) solution to remove the endometrium. ? Microwave. This method uses high-energy microwaves to heat up the endometrium and remove it. ? Thermal balloon. This method involves inserting a catheter with a balloon tip into the uterus. The balloon tip is  filled with heated fluid to remove the endometrium. The procedure may vary among health care providers and hospitals. What happens after the procedure?  Your blood pressure, heart rate, breathing rate, and blood oxygen level will be monitored until the medicines you were given have worn off.  As tissue healing occurs, you may  notice vaginal bleeding for 4-6 weeks after the procedure. You may also experience: ? Cramps. ? Thin, watery vaginal discharge that is light pink or [place] in color. ? A need to urinate more frequently than usual. ? Nausea.  Do not drive for 24 hours if you were given a sedative.  Do not have sex or insert anything into your vagina until your health care provider approves. Summary  Endometrial ablation is done to treat the many causes of heavy menstrual bleeding.  The procedure may be done only after medications have been tried to control the bleeding.  Plan to have someone take you home from the hospital or clinic. This information is not intended to replace advice given to you by your health care provider. Make sure you discuss any questions you have with your health care provider. Document Released: 04/24/2004 Document Revised: 07/02/2016 Document Reviewed: 07/02/2016 Elsevier Interactive Patient Education  2017 [place] Anesthesia, Adult, Care After These instructions provide you with information about caring for yourself after your procedure. Your health care provider may also give you more specific instructions. Your treatment has been planned according to current medical practices, but problems sometimes occur. Call your health care provider if you have any problems or questions after your procedure. What can I expect after the procedure? After the procedure, it is common to have:  Vomiting.  A sore throat.  Mental slowness.  It is common to feel:  Nauseous.  Cold or shivery.  Sleepy.  Tired.  Sore or achy, even in parts of your body where you did not have surgery.  Follow these instructions at home: For at least 24 hours after the procedure:  Do not: ? Participate in activities where you could fall or become injured. ? Drive. ? Use heavy machinery. ? Drink alcohol. ? Take sleeping pills or medicines that cause drowsiness. ? Make important  decisions or sign legal documents. ? Take care of children on your own.  Rest. Eating and drinking  If you vomit, drink water, juice, or soup when you can drink without vomiting.  Drink enough fluid to keep your urine clear or pale yellow.  Make sure you have little or no nausea before eating solid foods.  Follow the diet recommended by your health care provider. General instructions  Have a responsible adult stay with you until you are awake and alert.  Return to your normal activities as told by your health care provider. Ask your health care provider what activities are safe for you.  Take over-the-counter and prescription medicines only as told by your health care provider.  If you smoke, do not smoke without supervision.  Keep all follow-up visits as told by your health care provider. This is important. Contact a health care provider if:  You continue to have nausea or vomiting at home, and medicines are not helpful.  You cannot drink fluids or start eating again.  You cannot urinate after 8-12 hours.  You develop a skin rash.  You have fever.  You have increasing redness at the site of your procedure. Get help right away if:  You have difficulty breathing.  You have chest pain.  You  have unexpected bleeding.  You feel that you are having a life-threatening or urgent problem. This information is not intended to replace advice given to you by your health care provider. Make sure you discuss any questions you have with your health care provider. Document Released: 09/21/2000 Document Revised: 11/18/2015 Document Reviewed: 05/30/2015 Elsevier Interactive Patient Education  [employer].

## 2017-07-01 NOTE — Anesthesia Procedure Notes (Signed)
Procedure Name: LMA Insertion Date/Time: 07/01/2017 10:55 AM Performed by: Andree Elk, Amy A, CRNA Pre-anesthesia Checklist: Patient identified, Timeout performed, Emergency Drugs available, Suction available and Patient being monitored Patient Re-evaluated:Patient Re-evaluated prior to induction Oxygen Delivery Method: Circle system utilized Preoxygenation: Pre-oxygenation with 100% oxygen Induction Type: IV induction Ventilation: Mask ventilation without difficulty LMA: LMA inserted LMA Size: 3.0 Number of attempts: 1 Placement Confirmation: positive ETCO2 and breath sounds checked- equal and bilateral Tube secured with: Tape Dental Injury: Teeth and Oropharynx as per pre-operative assessment

## 2017-07-01 NOTE — Telephone Encounter (Signed)
Patient called and stated her husband picked up medication.  Nothing needed at this time.

## 2017-07-01 NOTE — H&P (Signed)
Expand All Collapse All   Patient ID: Jenny Brown, female   DOB: 21-Dec-1970, 47 y.o.   MRN: [id number] Preoperative History and Physical  Jenny Brown is a 47 y.o. [id number] here for surgical management of severe menorrhagia she had a partially prolapsed.  Cervical polyp which was removed is high up inside the uterus is possible in the office several weeks ago.  She was scheduled for hysteroscopy D&C and probable endometrial ablation after that and decided to wait until the first of the year for insurance reasons she had an U/S done today, prior to being seen. Ultrasound shows a moderate amount of fluid in the endometrial cavity and it appears she is cramping as the fluid is moving dynamically She has to change her pad about every hour and states today, she has passed a few large blood clots.  She has had some cramping into her back particular today.  She still committed to postponing the surgery until January she would like to have the surgery in the beginning of January 2019 because of her deductible. She notes the bleeding is worse on days she does increased heavy lifting. The patient denies any other symptoms or complaints at this time.  No significant preoperative concerns.  Proposed surgery: Hysteroscopy, D&C, removal of polyp and endometrial ablation      Past Medical History:  Diagnosis Date  . Essential hypertension, benign   . Hypertension   . OSA (obstructive sleep apnea)   . Restless legs syndrome   . Seizures ([place])         Past Surgical History:  Procedure Laterality Date  . TUBAL LIGATION                     OB History  Gravida Para Term Preterm AB Living  2 2 2     2   SAB TAB Ectopic Multiple Live Births          2    # Outcome Date GA Lbr Len/2nd Weight Sex Delivery Anes PTL Lv  2 Term     F Vag-Spont   LIV  1 Term     M Vag-Spont   LIV    Patient denies any other pertinent gynecologic issues.         Current Outpatient Medications on  File Prior to Visit  Medication Sig Dispense Refill  . ferrous sulfate 325 (65 FE) MG tablet Take 325 mg by mouth 3 (three) times daily with meals.     Marland Kitchen ibuprofen (ADVIL,MOTRIN) 200 MG tablet Take 600 mg every 6 (six) hours as needed by mouth for headache or moderate pain.     Marland Kitchen lisinopril-hydrochlorothiazide (PRINZIDE,ZESTORETIC) 20-25 MG tablet Take 1 tablet by mouth daily. 90 tablet 3  . sertraline (ZOLOFT) 50 MG tablet Take 1 tablet (50 mg total) by mouth daily. 30 tablet 3   No current facility-administered medications on file prior to visit.    No Known Allergies  Social History:   reports that  has never smoked. she has never used smokeless tobacco. She reports that she does not drink alcohol or use drugs.       Family History  Problem Relation Age of Onset  . Heart disease Mother   . Diabetes Mother     Review of Systems: Noncontributory  PHYSICAL EXAM: Blood pressure 108/60, pulse 97, height 5' (1.524 m), weight 177 lb 3.2 oz (80.4 kg).   PHYSICAL EXAMINATION General appearance - alert, well appearing, and in no distress, oriented  to person, place, and time and overweight Mental status - alert, oriented to person, place, and time, normal mood, behavior, speech, dress, motor activity, and thought processes, affect appropriate to mood Physical Examination: Chest - clear to auscultation, no wheezes, rales or rhonchi, symmetric air entry, unlabored breathing Heart - normal rate and regular rhythm Abdomen - soft, nontender, nondistended, no masses or organomegaly  PELVIC External genitalia - normal Vulva - normal Vagina - moderate blood Cervix - 1 cm dilated raising the question of a lower uterine segment remnant from the polyp noted earlier Uterus - anterior, mildly tender    Labs: No results found for this or any previous visit (from the past 336 hour(s)).  Imaging Studies: ImagingResults  No results found.    Assessment: 1. Probable  residual endometrial polyp 2.  Abnormal uterine bleeding, with benign tissue on recent polypectomy      Patient Active Problem List   Diagnosis Date Noted  . BPPV (benign paroxysmal positional vertigo) 07/14/2016  . Numbness and tingling of right arm and leg 05/14/2016  . Acute bilateral low back pain with bilateral sciatica 05/14/2016  . Postconcussive syndrome 04/23/2016  . Anemia 04/23/2016  . Concussion with loss of consciousness   . Generalized weakness   . Spells of decreased attentiveness   . Insomnia 12/26/2015  . Precordial pain 03/19/2014  . Essential hypertension, benign 03/19/2014  . OSA (obstructive sleep apnea) 03/19/2014    Plan: 1. Patient will undergo surgical management with Hysteroscopy, D&C, removal of polyp and endometrial ablation on Thursday 07/01/2017.  2.  Preoperative blood work 06/30/2017 3. F/u post-op check 5 weeks.     The patient has been interviewed and there has been no change in status. Bleeding has stopped the last few days. [name], MD

## 2017-07-01 NOTE — Transfer of Care (Signed)
Immediate Anesthesia Transfer of Care Note  Patient: Jenny Brown  Procedure(s) Performed: DILATATION & CURETTAGE/HYSTEROSCOPY WITH NOVASURE ABLATION (N/A )  Patient Location: PACU  Anesthesia Type:General  Level of Consciousness: awake, alert , oriented and patient cooperative  Airway & Oxygen Therapy: Patient Spontanous Breathing and Patient connected to face mask oxygen  Post-op Assessment: Report given to RN and Post -op Vital signs reviewed and stable  Post vital signs: Reviewed and stable  Last Vitals:  Vitals:   07/01/17 1035 07/01/17 1136  BP: 121/75 130/80  Pulse:  95  Resp:  14  Temp:  36.8 C  SpO2: 95% 99%    Last Pain:  Vitals:   07/01/17 0809  TempSrc: Oral         Complications: No apparent anesthesia complications

## 2017-07-01 NOTE — Brief Op Note (Signed)
07/01/2017  11:22 AM  PATIENT:  Jenny Brown  47 y.o. female  PRE-OPERATIVE DIAGNOSIS:  Abnormal Bleeding Endometrial Polyp Anemia  POST-OPERATIVE DIAGNOSIS:  Abnormal Bleeding anemia  PROCEDURE:  Procedure(s) with comments: DILATATION & CURETTAGE/HYSTEROSCOPY WITH NOVASURE ABLATION (N/A) - dr. requests 10:00 start  SURGEON:  Surgeon(s) and Role:    * [name], MD - Primary  PHYSICIAN ASSISTANT:   ASSISTANTS: none   ANESTHESIA:   local and general  EBL:  10 mL   BLOOD ADMINISTERED:none  DRAINS: none   LOCAL MEDICATIONS USED:  MARCAINE     SPECIMEN:  No Specimen  DISPOSITION OF SPECIMEN:  N/A  COUNTS:  YES  TOURNIQUET:  * No tourniquets in log *  DICTATION: .Dragon Dictation  PLAN OF CARE: Discharge to home after PACU  PATIENT DISPOSITION:  PACU - hemodynamically stable.   Delay start of Pharmacological VTE agent (>24hrs) due to surgical blood loss or risk of bleeding: not applicable Details of procedure: Patient was taken the operating room prepped and draped for vaginal procedure with legs in low lithotomy yellowfin support.  Timeout was conducted, Ancef administered and procedure confirmed by operative team.  Cervix was grasped with single-tooth tenaculum.  Inspection of the perineum showed a first-degree uterine descensus and a visible evidence of a rectocele.  These were not being addressed as a part of this procedure.  Paracervical block with 20 cc of Marcaine solution was injected, and then the uterus sounded to 9 and a 10 m, dilated to 23 [place] allowing introduction of the rigid 30 degree hysteroscope into the uterine cavity with photos documenting a thin erythematous endometrial cavity with tubal ostia visible.  There was no evidence of perforation and no suspicion of complications.  There was no residual endometrial polyp.  The hysteroscope was used to measure the endometrial cavity at about 4 cm in length the cervix was somewhat patulous and did not  develop a very good seal around the hysteroscope even though we only dilated to 23 [place] and there was fluid loss during the procedure which did not interfere with the successful completion of the procedure. The NovaSure endometrial ablation device was on package, prepared and inserted measured at a length of 4.0 cm and width of 3.7 cm and was activated after testing and a 1 minute 55 seconds endometrial ablation sequence completed.  Prior to this procedure we had performed brief curettage which obtained discharged home with Toradol and tramadol for pain and follow-up in 2 weeks essentially no tissue.  Repeat hysteroscopy prior to the ablation showed that the endometrial cavity was indeed very thin as suspected upon initial inspection. Upon completion of the 1 minute 55 seconds ablation sequence the device was removed in standard fashion and patient allowed to go to recovery room in stable condition with sponge and needle counts correct

## 2017-07-01 NOTE — Anesthesia Preprocedure Evaluation (Signed)
Anesthesia Evaluation  Patient identified by MRN, date of birth, ID band Patient awake    Reviewed: Allergy & Precautions, NPO status , Patient's Chart, lab work & pertinent test results  Airway Mallampati: III  TM Distance: >3 FB Neck ROM: Full  Mouth opening: Limited Mouth Opening  Dental  (+) Teeth Intact   Pulmonary sleep apnea ,    breath sounds clear to auscultation       Cardiovascular hypertension, Pt. on medications  Rhythm:Regular Rate:Normal     Neuro/Psych Seizures -, Well Controlled,     GI/Hepatic negative GI ROS, Neg liver ROS,   Endo/Other  negative endocrine ROS  Renal/GU negative Renal ROS     Musculoskeletal   Abdominal   Peds  Hematology  (+) anemia ,   Anesthesia Other Findings   Reproductive/Obstetrics                             Anesthesia Physical Anesthesia Plan  ASA: II  Anesthesia Plan: General   Post-op Pain Management:    Induction: Intravenous  PONV Risk Score and Plan:   Airway Management Planned: LMA  Additional Equipment:   Intra-op Plan:   Post-operative Plan: Extubation in OR  Informed Consent: I have reviewed the patients History and Physical, chart, labs and discussed the procedure including the risks, benefits and alternatives for the proposed anesthesia with the patient or authorized representative who has indicated his/her understanding and acceptance.     Plan Discussed with:   Anesthesia Plan Comments:         Anesthesia Quick Evaluation

## 2017-07-02 ENCOUNTER — Encounter (HOSPITAL_COMMUNITY): Payer: Self-pay | Admitting: Obstetrics and Gynecology

## 2017-07-12 ENCOUNTER — Encounter: Payer: BC Managed Care – PPO | Admitting: Obstetrics and Gynecology

## 2017-07-14 ENCOUNTER — Ambulatory Visit: Payer: BC Managed Care – PPO | Admitting: Obstetrics and Gynecology

## 2017-07-14 ENCOUNTER — Encounter: Payer: Self-pay | Admitting: Obstetrics and Gynecology

## 2017-07-14 VITALS — BP 120/64 | HR 79 | Ht 60.0 in | Wt 179.0 lb

## 2017-07-14 DIAGNOSIS — Z4889 Encounter for other specified surgical aftercare: Secondary | ICD-10-CM

## 2017-07-14 DIAGNOSIS — Z9889 Other specified postprocedural states: Secondary | ICD-10-CM

## 2017-07-14 NOTE — Progress Notes (Signed)
Patient ID: Jenny Brown, female   DOB: June 29, 1971, 47 y.o.   MRN: [id number]    Subjective:  Jenny Brown is a 47 y.o. female now 2 weeks status post Pl[place].   She is doing well. She had a mild discharge at first but it has since resolved.   Review of Systems Negative    Diet:   normal   Bowel movements : normal.  The patient is not having any pain.  Objective:  BP 120/64 (BP Location: Right Arm, Patient Position: Sitting, Cuff Size: Normal)   Pulse 79   Ht 5' (1.524 m)   Wt 179 lb (81.2 kg)   BMI 34.96 kg/m  General:Well developed, well nourished.  No acute distress. Abdomen: Bowel sounds normal, soft, non-tender. Pelvic Exam:    External Genitalia:  Normal.    Vagina: Normal    Cervix: Normal    Uterus: Normal, first degree uterine descensus, retroverted    Adnexa/Bimanual: Normal  Incision(s):   Healing well, no drainage, no erythema, no hernia, no swelling, no dehiscence,     Assessment:  Post-Op 2 weeks s/p DILATATION & CURETTAGE/HYSTEROSCOPY WITH NOVASURE ABLATION    Doing well postoperatively.   Plan:  1.Wound care discussed   2. . current medications. 3. Activity restrictions: none 4. return to work: now. 5. Follow up in PRN  By signing my name below, I, [name], attest that this documentation has been prepared under the direction and in the presence of [name], MD. Electronically Signed: [name], Medical Scribe. 07/14/17. 2:35 PM.  I personally performed the services described in this documentation, which was SCRIBED in my presence. The recorded information has been reviewed and considered accurate. It has been edited as necessary during review. [name], MD

## 2017-08-30 ENCOUNTER — Other Ambulatory Visit: Payer: Self-pay | Admitting: Pediatrics

## 2017-12-23 ENCOUNTER — Other Ambulatory Visit: Payer: Self-pay | Admitting: Pediatrics

## 2017-12-23 DIAGNOSIS — F338 Other recurrent depressive disorders: Secondary | ICD-10-CM

## 2017-12-24 ENCOUNTER — Encounter: Payer: Self-pay | Admitting: *Deleted

## 2018-02-22 IMAGING — CT CT L SPINE W/O CM
3 series · 14 of 33 positions shown, 17 images · non-contrast
Comparison: None.

CLINICAL DATA: Patient fell and hit the book shelf has low back
pain

EXAM:
CT LUMBAR SPINE WITHOUT CONTRAST
TECHNIQUE: Multidetector CT imaging of the lumbar spine was performed without
intravenous contrast administration. Multiplanar CT image
reconstructions were also generated.

[Series 3: l-spine 2.0 st · axial · 0.30mm/px · z∈[-284,-116]mm · 6 of 110 slices shown, 8 images]
[im 17/110  soft-tissue]
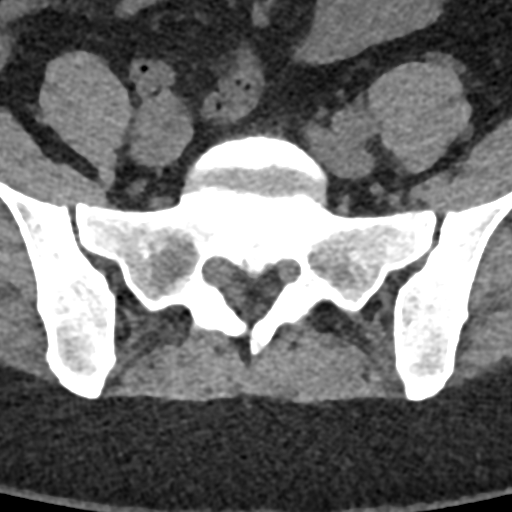
[im 17/110  bone]
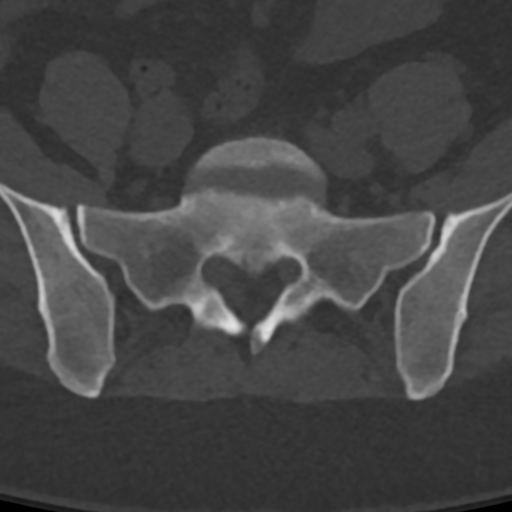
[im 34/110  bone]
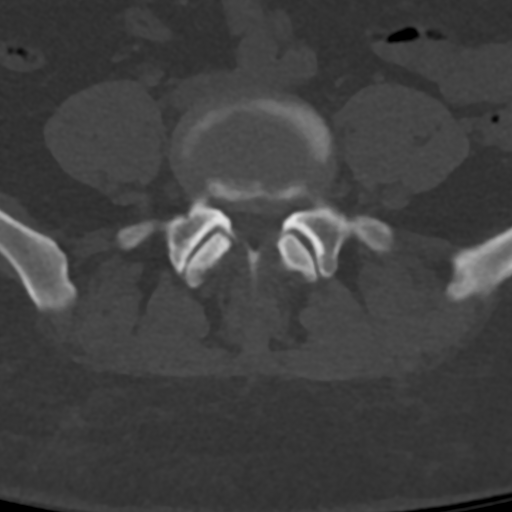
[im 51/110  bone]
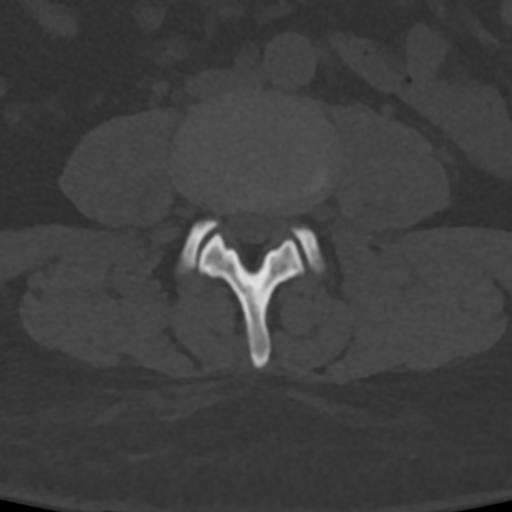
[im 68/110  bone]
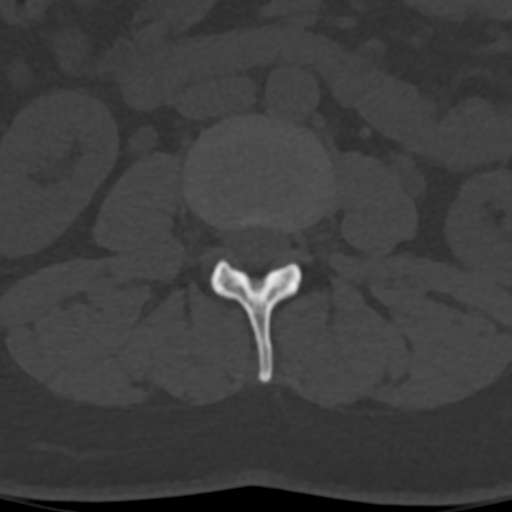
[im 84/110  soft-tissue]
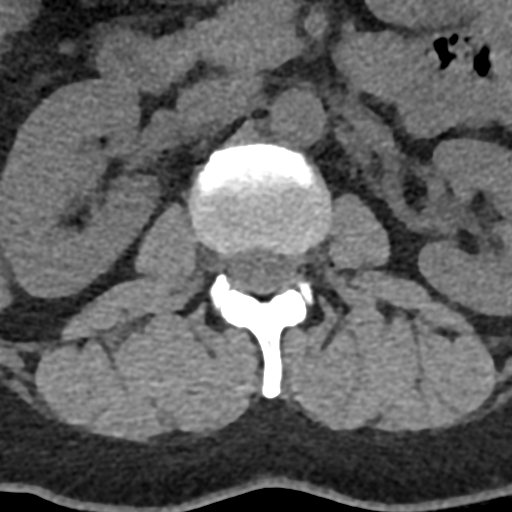
[im 84/110  bone]
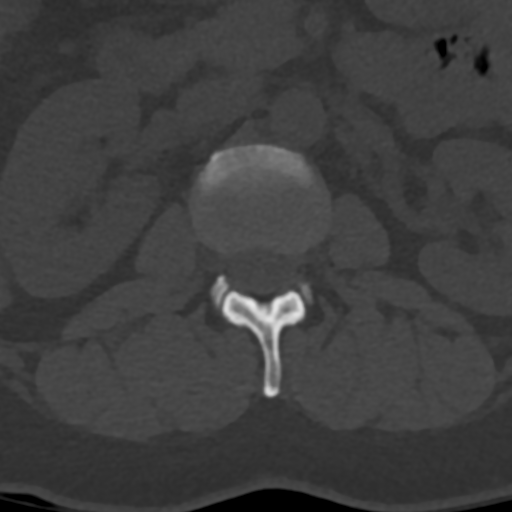
[im 101/110  bone]
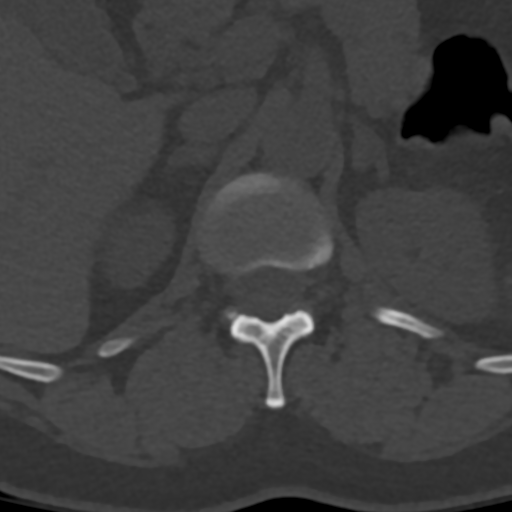

[Series 5: l-spine 2.0 cor bone · coronal · 0.32mm/px · 3 of 58 slices shown]
[im 12/58  bone]
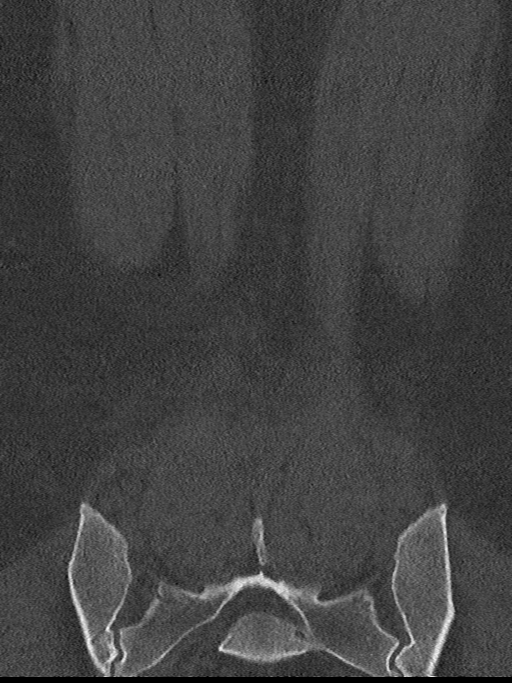
[im 23/58  bone]
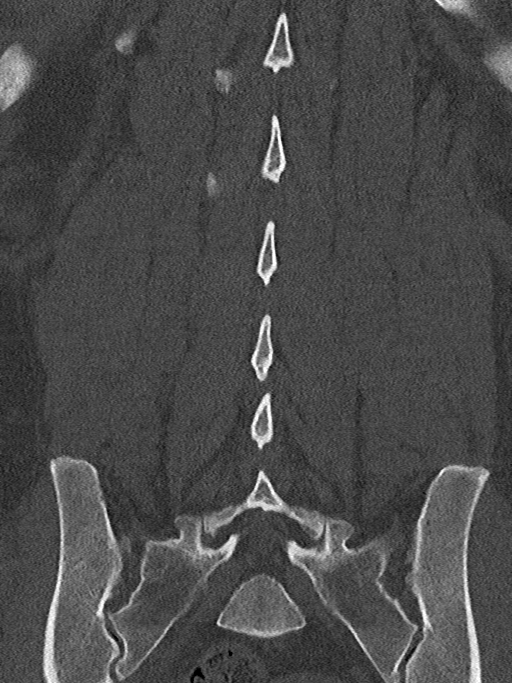
[im 35/58  bone]
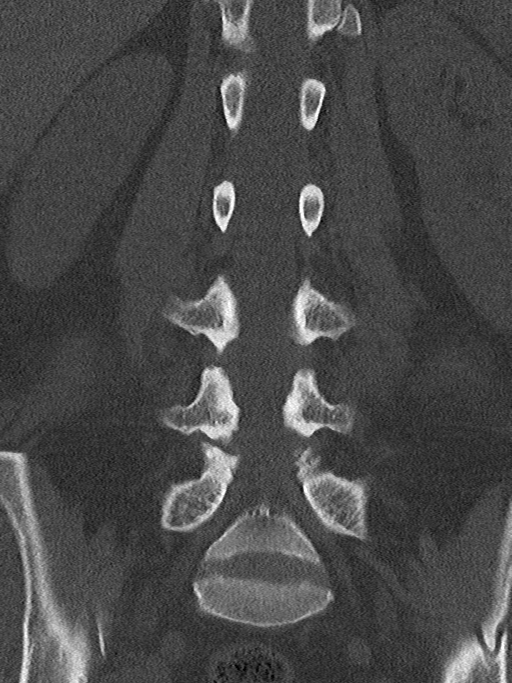

[Series 6: l-spine 2.0 sag bone · sagittal · 0.32mm/px · 5 of 62 slices shown, 6 images]
[im 21/62  bone]
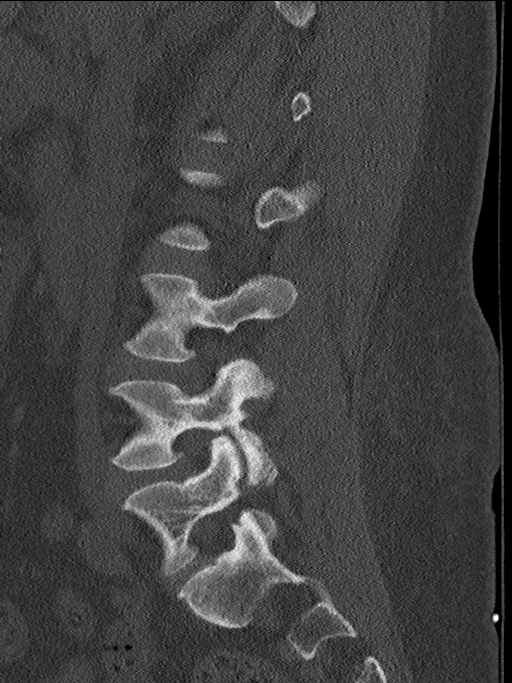
[im 26/62  bone]
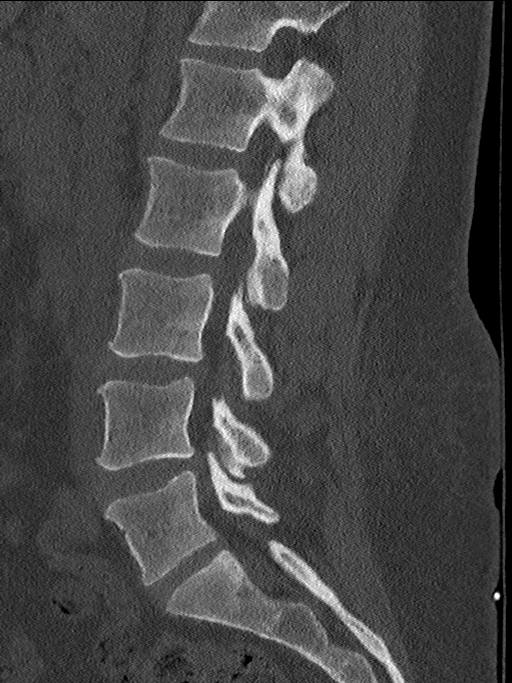
[im 31/62  soft-tissue]
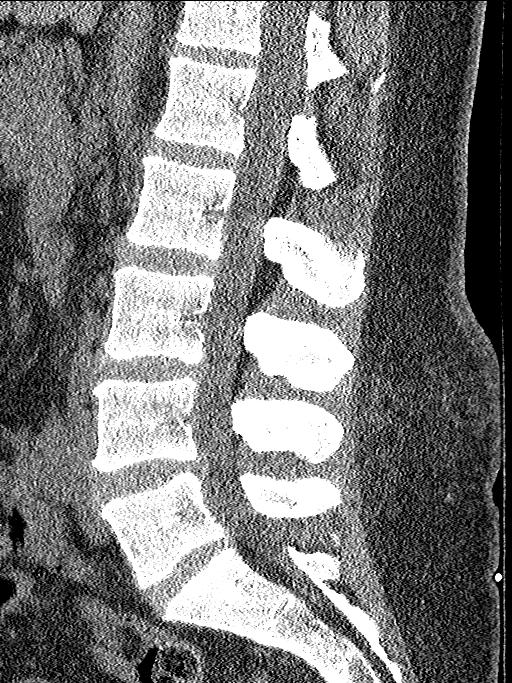
[im 31/62  bone]
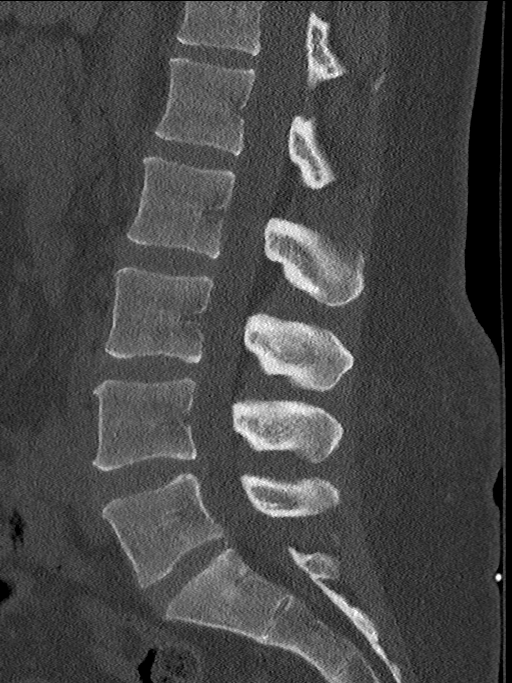
[im 36/62  bone]
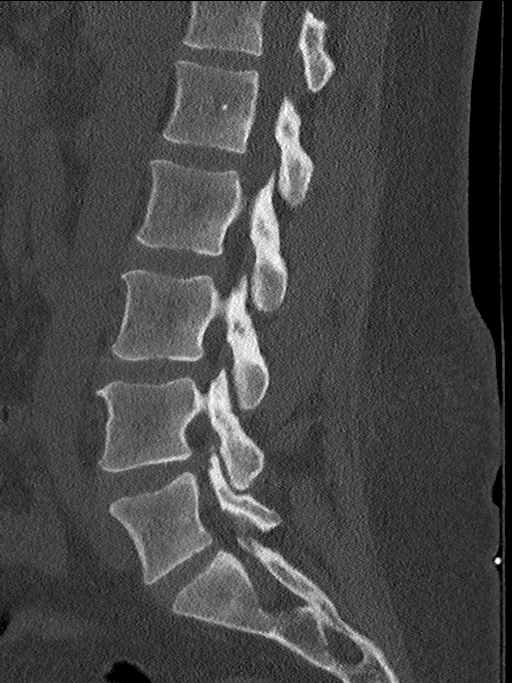
[im 41/62  bone]
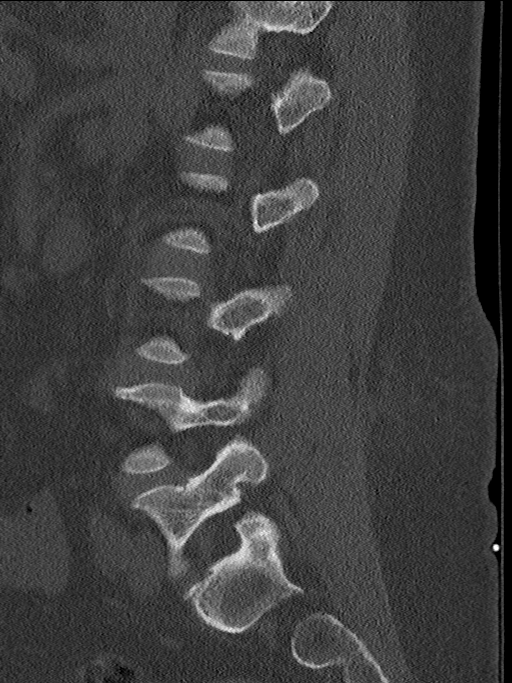

[14 of 33 positions shown; findings below may reference images not displayed]

FINDINGS: Segmentation: Normal, 5 non-rib-bearing lumbar type vertebra. First
non rib-bearing lumbar vertebra will be designated L1.

Alignment: Within normal limits.

Vertebrae: Normal stature.  No fracture.

Paraspinal and other soft tissues: No paravertebral or paraspinal
soft tissue abnormality. Imaged kidneys are within normal limits.
Mild subcutaneous edema posteriorly.

Disc levels: No significant disc space narrowing. Minimal anterior
osteophytes at L2-L3, L3-L4, and L4-L5. Mild disc bulges suggested
at L3-L4, L4-L5.
IMPRESSION: 1. No acute fracture or malalignment.
2. Minimal disc disease.  Suggested disc bulges at L3-L4, L4-L5.

## 2018-03-07 ENCOUNTER — Ambulatory Visit (INDEPENDENT_AMBULATORY_CARE_PROVIDER_SITE_OTHER): Payer: BC Managed Care – PPO | Admitting: Obstetrics and Gynecology

## 2018-03-07 ENCOUNTER — Encounter: Payer: Self-pay | Admitting: Obstetrics and Gynecology

## 2018-03-07 ENCOUNTER — Other Ambulatory Visit (HOSPITAL_COMMUNITY)
Admission: RE | Admit: 2018-03-07 | Discharge: 2018-03-07 | Disposition: A | Discharge status: Home / Self Care | Payer: BC Managed Care – PPO | Source: Ambulatory Visit | Attending: Obstetrics and Gynecology | Admitting: Obstetrics and Gynecology

## 2018-03-07 VITALS — BP 123/84 | HR 113 | Ht 61.0 in | Wt 180.8 lb

## 2018-03-07 DIAGNOSIS — Z01419 Encounter for gynecological examination (general) (routine) without abnormal findings: Secondary | ICD-10-CM | POA: Insufficient documentation

## 2018-03-07 DIAGNOSIS — M545 Low back pain: Secondary | ICD-10-CM

## 2018-03-07 LAB — POCT URINALYSIS DIPSTICK OB
GLUCOSE, UA: NEGATIVE
Ketones, UA: NEGATIVE
Nitrite, UA: POSITIVE
POC,PROTEIN,UA: NEGATIVE
RBC UA: NEGATIVE

## 2018-03-07 MED ORDER — SULFAMETHOXAZOLE-TRIMETHOPRIM 400-80 MG PO TABS: 1.0000 | ORAL_TABLET | Freq: Two times a day (BID) | ORAL | 0 refills | Status: DC

## 2018-03-07 NOTE — Progress Notes (Signed)
Patient ID: Jenny Brown, female   DOB: [birth year], 47 y.o.   MRN: [id number]   Assessment:  UTI Rectocele Pelvic discomfort, felt due to pelvic relaxation but with concurrent uti, it is difficult to assess Annual exam Plan:  1. pap smear done, next pap due 3 years 2. rx Septra DS 5 days 3    2 week f/u for GYN and TV U/S  Subjective:  Jenny Brown is a 47 y.o. female G2P2002 who presents for annual exam. No LMP recorded. Patient has had an ablation. The patient has complaints today of back and pelvic pain  Had D&C and only bleeds when having intercourse with pain worsening. Had ablation done in January, she hasn't had a period since. She has lower abdomen pressure with tenderness; bowel movements get stuck.  The following portions of the patient's history were reviewed and updated as appropriate: allergies, current medications, past family history, past medical history, past social history, past surgical history and problem list. Past Medical History:  Diagnosis Date  . Essential hypertension, benign   . Hypertension   . OSA (obstructive sleep apnea)   . Restless legs syndrome   . Seizures ([place])     Past Surgical History:  Procedure Laterality Date  . DILITATION & CURRETTAGE/HYSTROSCOPY WITH NOVASURE ABLATION N/A 07/01/2017   Procedure: DILATATION & CURETTAGE/HYSTEROSCOPY WITH NOVASURE ABLATION;  Surgeon: [name], MD;  Location: [facility];  Service: Gynecology;  Laterality: N/A;  dr. requests 10:00 start  . TUBAL LIGATION       Current Outpatient Medications:  .  lisinopril-hydrochlorothiazide (PRINZIDE,ZESTORETIC) 20-25 MG tablet, TAKE 1 TABLET BY MOUTH ONCE DAILY (NEEDS  TO  BE  SEEN  FOR  REFILLS), Disp: 90 tablet, Rfl: 0 .  sertraline (ZOLOFT) 50 MG tablet, TAKE 1 TABLET BY MOUTH ONCE DAILY, Disp: 90 tablet, Rfl: 0  Review of Systems Constitutional: negative Gastrointestinal: negative Genitourinary: burning with peeing and retaining fluid  Objective:  BP 123/84 (BP  Location: Right Arm, Patient Position: Sitting, Cuff Size: Normal)   Pulse (!) 113   Ht 5\' 1"  (1.549 m)   Wt 180 lb 12.8 oz (82 kg)   BMI 34.16 kg/m    BMI: Body mass index is 34.16 kg/m.  General Appearance: Alert, appropriate appearance for age. No acute distress HEENT: Grossly normal Neck / Thyroid:  Cardiovascular: RRR; normal S1, S2, no murmur Lungs: CTA bilaterally Back: No CVAT Breast Exam: No dimpling, nipple retraction or discharge, slight tenderness. No masses or nodes, Normal to inspection. Gastrointestinal: Soft, non-tender, no masses or organomegaly Pelvic Exam:  VAGINA: weak back wall, rectocele CERVIX: retroverted UTERUS: uterine senstivity 1st degree descensus,  PAP: Pap smear done today. Lymphatic Exam: Non-palpable nodes in neck, clavicular, axillary, or inguinal regions  Skin: no rash or abnormalities Neurologic: Normal gait and speech, no tremor  Psychiatric: Alert and oriented, appropriate affect.  Urinalysis:nitrite positive, leuk 2+  By signing my name below, I, Samul Dada, attest that this documentation has been prepared under the direction and in the presence of [name], MD. Electronically Signed: [place]. 03/07/18. 4:16 PM.  I personally performed the services described in this documentation, which was SCRIBED in my presence. The recorded information has been reviewed and considered accurate. It has been edited as necessary during review. [name], MD

## 2018-03-09 LAB — URINE CULTURE

## 2018-03-10 ENCOUNTER — Other Ambulatory Visit: Payer: Self-pay | Admitting: Obstetrics and Gynecology

## 2018-03-10 LAB — CYTOLOGY - PAP
Diagnosis: NEGATIVE
HPV (WINDOPATH): NOT DETECTED

## 2018-03-10 MED ORDER — MICONAZOLE NITRATE 2 % VA CREA: 1.0000 | TOPICAL_CREAM | Freq: Every day | VAGINAL | Status: DC

## 2018-03-18 ENCOUNTER — Other Ambulatory Visit: Payer: Self-pay | Admitting: Obstetrics and Gynecology

## 2018-03-18 DIAGNOSIS — Z1231 Encounter for screening mammogram for malignant neoplasm of breast: Secondary | ICD-10-CM

## 2018-03-21 ENCOUNTER — Encounter: Payer: Self-pay | Admitting: Obstetrics and Gynecology

## 2018-03-21 ENCOUNTER — Ambulatory Visit: Payer: BC Managed Care – PPO | Admitting: Obstetrics and Gynecology

## 2018-03-21 ENCOUNTER — Other Ambulatory Visit (INDEPENDENT_AMBULATORY_CARE_PROVIDER_SITE_OTHER): Payer: BC Managed Care – PPO

## 2018-03-21 ENCOUNTER — Other Ambulatory Visit: Payer: Self-pay | Admitting: Obstetrics and Gynecology

## 2018-03-21 VITALS — BP 133/82 | HR 78

## 2018-03-21 DIAGNOSIS — N941 Unspecified dyspareunia: Secondary | ICD-10-CM | POA: Diagnosis not present

## 2018-03-21 DIAGNOSIS — R102 Pelvic and perineal pain: Secondary | ICD-10-CM

## 2018-03-21 DIAGNOSIS — N814 Uterovaginal prolapse, unspecified: Secondary | ICD-10-CM | POA: Diagnosis not present

## 2018-03-21 DIAGNOSIS — N816 Rectocele: Secondary | ICD-10-CM | POA: Diagnosis not present

## 2018-03-21 NOTE — Progress Notes (Signed)
PELVIC US TA/TV: homogeneous retroverted uterus,wnl,EEC 9.1 mm,normal right ovary,small amount of simple right adnexal fluid,two simple left ovarian cysts (#1) 3.1 x 2.7 x 2.5 cm,(#2) 2.1 x 2 x 2.3 cm,ovaries appear mobile,bilat adnexal pain during ultrasound

## 2018-03-21 NOTE — Progress Notes (Signed)
Patient ID: Jenny Brown, female   DOB: 1970/08/28, 47 y.o.   MRN: [id number]    [facility] Visit  @DATE @            Patient name: Jenny Brown MRN [id number]  Date of birth: 1970-09-24  CC & HPI:  Kip Kautzman is a 47 y.o. female presenting today for lower abdominal pain and pain with intercourse. Has occasional bowel movement get stuck but nothing too bothersome. TV U/S showed: posterior uterus ROS:  ROS +painful intercourse -fever -chills All systems are negative except as noted in the HPI and PMH.   Pertinent History Reviewed:   Reviewed: Medical         Past Medical History:  Diagnosis Date  . Essential hypertension, benign   . Hypertension   . OSA (obstructive sleep apnea)   . Restless legs syndrome   . Seizures ([place])                               Surgical Hx:    Past Surgical History:  Procedure Laterality Date  . DILITATION & CURRETTAGE/HYSTROSCOPY WITH NOVASURE ABLATION N/A 07/01/2017   Procedure: DILATATION & CURETTAGE/HYSTEROSCOPY WITH NOVASURE ABLATION;  Surgeon: [name], MD;  Location: [facility];  Service: Gynecology;  Laterality: N/A;  dr. requests 10:00 start  . TUBAL LIGATION     Medications: Reviewed & Updated - see associated section                       Current Outpatient Medications:  .  lisinopril-hydrochlorothiazide (PRINZIDE,ZESTORETIC) 20-25 MG tablet, TAKE 1 TABLET BY MOUTH ONCE DAILY (NEEDS  TO  BE  SEEN  FOR  REFILLS), Disp: 90 tablet, Rfl: 0 .  sertraline (ZOLOFT) 50 MG tablet, TAKE 1 TABLET BY MOUTH ONCE DAILY, Disp: 90 tablet, Rfl: 0  Current Facility-Administered Medications:  .  miconazole (MONISTAT 7) 2 % vaginal cream 1 Applicatorful, 1 Applicatorful, Vaginal, QHS, [name], MD   Social History: Reviewed -  reports that she has never smoked. She has never used smokeless tobacco.  Objective Findings:  Vitals: Blood pressure 133/82, pulse 78.  PHYSICAL EXAMINATION General appearance - alert, well appearing, and in  no distress and oriented to person, place, and time Mental status - alert, oriented to person, place, and time, normal mood, behavior, speech, dress, motor activity, and thought processes, affect appropriate to mood  PELVIC NOT DONE- DISCUSSION OF TV U/S ONLY  Assessment & Plan:   A:  1. dyspareunia due to uterine retroversion 2.  Rectocele  P:  1.  vaginal hysterectomy with posterior repair 2. 2 week pre-op exam   By signing my name below, I, Jenny Brown, attest that this documentation has been prepared under the direction and in the presence of [name], MD. Electronically Signed: [place]. 03/21/18. 4:21 PM.  I personally performed the services described in this documentation, which was SCRIBED in my presence. The recorded information has been reviewed and considered accurate. It has been edited as necessary during review. [name], MD

## 2018-03-22 ENCOUNTER — Ambulatory Visit: Payer: BC Managed Care – PPO | Admitting: Pediatrics

## 2018-03-22 DIAGNOSIS — N941 Unspecified dyspareunia: Secondary | ICD-10-CM | POA: Insufficient documentation

## 2018-03-23 ENCOUNTER — Ambulatory Visit (HOSPITAL_COMMUNITY): Payer: BC Managed Care – PPO

## 2018-04-05 NOTE — Patient Instructions (Signed)
Jenny Brown  04/05/2018     @PREFPERIOPPHARMACY @   Your procedure is scheduled on  04/12/2018   Report to [facility] at  32   A.M.  Call this number if you have problems the morning of surgery:  [phone]   Remember:  Do not eat or drink after midnight.  You may drink clear liquids until  12 midnight 04/11/2018 .  Clear liquids allowed are:                    Water, Juice (non-citric and without pulp), Carbonated beverages, Clear Tea, Black Coffee only, Plain Jell-O only, Gatorade and Plain Popsicles only    Take these medicines the morning of surgery with A SIP OF WATER  Lisinopril, zoloft.    Do not wear jewelry, make-up or nail polish.  Do not wear lotions, powders, or perfumes, or deodorant.  Do not shave 48 hours prior to surgery.  Men may shave face and neck.  Do not bring valuables to the hospital.  [facility] is not responsible for any belongings or valuables.  Contacts, dentures or bridgework may not be worn into surgery.  Leave your suitcase in the car.  After surgery it may be brought to your room.  For patients admitted to the hospital, discharge time will be determined by your treatment team.  Patients discharged the day of surgery will not be allowed to drive home.   Name and phone number of your driver:   family Special instructions:  Follow the enclosed prep instructions given to you by Dr Johnnye Sima office.  Please read over the following fact sheets that you were given. Anesthesia Post-op Instructions and Care and Recovery After Surgery       Vaginal Hysterectomy A vaginal hysterectomy is a procedure to remove all or part of the uterus through a small incision in the vagina. In this procedure, your health care provider may remove your entire uterus, including the lower end (cervix). You may need a vaginal hysterectomy to treat:  Uterine fibroids.  A condition that causes the lining of the uterus to grow in other areas  (endometriosis).  Problems with pelvic support.  Cancer of the cervix, ovaries, uterus, or tissue that lines the uterus (endometrium).  Excessive (dysfunctional) uterine bleeding.  When removing your uterus, your health care provider may also remove the organs that produce eggs (ovaries) and the tubes that carry eggs to your uterus (fallopian tubes). After a vaginal hysterectomy, you will no longer be able to have a baby. You will also no longer get your menstrual period. Tell a health care provider about:  Any allergies you have.  All medicines you are taking, including vitamins, herbs, eye drops, creams, and over-the-counter medicines.  Any problems you or family members have had with anesthetic medicines.  Any blood disorders you have.  Any surgeries you have had.  Any medical conditions you have.  Whether you are pregnant or may be pregnant. What are the risks? Generally, this is a safe procedure. However, problems may occur, including:  Bleeding.  Infection.  A blood clot that forms in your leg and travels to your lungs (pulmonary embolism).  Damage to surrounding organs.  Pain during sex.  What happens before the procedure?  Ask your health care provider what organs will be removed during surgery.  Ask your health care provider about: ? Changing or stopping your regular medicines. This is especially important  if you are taking diabetes medicines or blood thinners. ? Taking medicines such as aspirin and ibuprofen. These medicines can thin your blood. Do not take these medicines before your procedure if your health care provider instructs you not to.  Follow instructions from your health care provider about eating or drinking restrictions.  Do not use any tobacco products, such as cigarettes, chewing tobacco, and e-cigarettes. If you need help quitting, ask your health care provider.  Plan to have someone take you home after discharge from the hospital. What  happens during the procedure?  To reduce your risk of infection: ? Your health care team will wash or sanitize their hands. ? Your skin will be washed with soap.  An IV tube will be inserted into one of your veins.  You may be given antibiotic medicine to help prevent infection.  You will be given one or more of the following: ? A medicine to help you relax (sedative). ? A medicine to numb the area (local anesthetic). ? A medicine to make you fall asleep (general anesthetic). ? A medicine that is injected into an area of your body to numb everything beyond the injection site (regional anesthetic).  Your surgeon will make an incision in your vagina.  Your surgeon will locate and remove all or part of your uterus.  Your ovaries and fallopian tubes may be removed at the same time.  The incision will be closed with stitches (sutures) that dissolve over time. The procedure may vary among health care providers and hospitals. What happens after the procedure?  Your blood pressure, heart rate, breathing rate, and blood oxygen level will be monitored often until the medicines you were given have worn off.  You will be encouraged to get up and walk around after a few hours to help prevent complications.  You may have IV tubes in place for a few days.  You will be given pain medicine as needed.  Do not drive for 24 hours if you were given a sedative. This information is not intended to replace advice given to you by your health care provider. Make sure you discuss any questions you have with your health care provider. Document Released: 10/07/2015 Document Revised: 11/21/2015 Document Reviewed: 06/30/2015 Elsevier Interactive Patient Education  2018 [place].  Vaginal Hysterectomy, Care After Refer to this sheet in the next few weeks. These instructions provide you with information about caring for yourself after your procedure. Your health care provider may also give you more  specific instructions. Your treatment has been planned according to current medical practices, but problems sometimes occur. Call your health care provider if you have any problems or questions after your procedure. What can I expect after the procedure? After the procedure, it is common to have:  Pain.  Soreness and numbness in your incision areas.  Vaginal bleeding and discharge.  Constipation.  Temporary problems emptying the bladder.  Feelings of sadness or other emotions.  Follow these instructions at home: Medicines  Take over-the-counter and prescription medicines only as told by your health care provider.  If you were prescribed an antibiotic medicine, take it as told by your health care provider. Do not stop taking the antibiotic even if you start to feel better.  Do not drive or operate heavy machinery while taking prescription pain medicine. Activity  Return to your normal activities as told by your health care provider. Ask your health care provider what activities are safe for you.  Get regular exercise as told  by your health care provider. You may be told to take short walks every day and go farther each time.  Do not lift anything that is heavier than 10 lb (4.5 kg). General instructions   Do not put anything in your vagina for 6 weeks after your surgery or as told by your health care provider. This includes tampons and douches.  Do not have sex until your health care provider says you can.  Do not take baths, swim, or use a hot tub until your health care provider approves.  Drink enough fluid to keep your urine clear or pale yellow.  Do not drive for 24 hours if you were given a sedative.  Keep all follow-up visits as told by your health care provider. This is important. Contact a health care provider if:  Your pain medicine is not helping.  You have a fever.  You have redness, swelling, or pain at your incision site.  You have blood, pus, or a  bad-smelling discharge from your vagina.  You continue to have difficulty urinating. Get help right away if:  You have severe abdominal or back pain.  You have heavy bleeding from your vagina.  You have chest pain or shortness of breath. This information is not intended to replace advice given to you by your health care provider. Make sure you discuss any questions you have with your health care provider. Document Released: 10/07/2015 Document Revised: 11/21/2015 Document Reviewed: 06/30/2015 Elsevier Interactive Patient Education  2018 [place].  Anterior and Posterior Colporrhaphy Anterior or posterior colporrhaphy is surgery to fix a prolapse of organs in the genital tract. Prolapse means the falling down, bulging, dropping, or drooping of an organ. Organs that commonly prolapse include the rectum, bladder, vagina, and uterus. Prolapse can affect a single organ or several organs at the same time. This often worsens when women stop having their monthly periods (menopause) because estrogen loss weakens the muscles and tissues in the genital tract. In addition, prolapse happens when the organs are damaged or weakened. This commonly happens after childbirth and as a result of aging. Surgery is often done for severe prolapses. The type of colporrhaphy done depends on the type of genital prolapse. Types of genital prolapse include the following:  Cystocele. This is a prolapse of the upper (anterior) wall of the vagina. The anterior wall bulges into the vagina and brings the bladder with it.  Rectocele. This is a prolapse of the lower (posterior) wall of the vagina. The posterior vaginal wall bulges into the vagina and brings the rectum with it.  Enterocele. This is a prolapse of part of the pelvic organs called the pouch of Douglas. It also involves a portion of the small bowel. It appears as a bulge under the neck of the uterus at the top of the back wall of the vagina.  Procidentia. This  is a complete prolapse of the uterus and the cervix. The prolapse can be seen and felt coming out of the vagina.  LET [facility] CARE PROVIDER KNOW ABOUT:  Any allergies you have.  All medicines you are taking, including vitamins, herbs, eye drops, creams, and over-the-counter medicines.  Previous problems you or members of your family have had with the use of anesthetics.  Any blood disorders you have.  Previous surgeries you have had.  Medical conditions you have.  Smoking history or history of alcohol use.  Possibility of pregnancy, if this applies. RISKS AND COMPLICATIONS Generally, anterior or posterior colporrhaphy is a  safe procedure. However, as with any procedure, complications can occur. Possible complications include:  Infection.  Damage to other organs during surgery.  Bleeding after surgery.  Problems urinating.  Problems from the anesthetic.  BEFORE THE PROCEDURE  Ask your health care provider about changing or stopping your regular medicines.  Do not eat or drink anything for at least 8 hours before the surgery.  If you smoke, do not smoke for at least 2 weeks before the surgery.  Make plans to have someone drive you home after your hospital stay. Also, arrange for someone to help you with activities during recovery. PROCEDURE You may be given medicine to help you relax before the surgery (sedative). During the surgery you will be given medicine to make you sleep through the procedure (general anesthetic) or medicine to numb you from the waist down (spinal anesthetic). This medicine will be given through an intravenous (IV) access tube that is put into one of your veins. The procedure will vary depending on the type of repair:  Anterior repair. A cut (incision) is made in the midline section of the front part of the vaginal wall. A triangular-shaped piece of vaginal tissue is removed, and the stronger, healthier tissue is sewn together in order to support  and suspend the bladder.  Posterior repair. An incision is made midline on the back wall of the vagina. A triangular portion of vaginal skin is removed to expose the muscle. Excess tissue is removed, and stronger, healthier muscle and ligament tissue is sewn together to support the rectum.  Anterior and posterior repair. Both procedures are done during the same surgery.  What to expect after the procedure You will be taken to a recovery area. Your blood pressure, pulse, breathing, and temperature (vital signs) will be monitored. This is done until you are stable. Then you will be transferred to a hospital room. After surgery, you will have a small rubber tube in place to drain your bladder (urinary catheter). This will be in place for 2 to 7 days or until your bladder is working properly on its own. The IV access tube will be removed in 1 to 3 days. You may have a gauze packing in your vagina to prevent bleeding. This will be removed 2 or 3 days after the surgery. You will likely need to stay in the hospital for 3 to 5 days. This information is not intended to replace advice given to you by your health care provider. Make sure you discuss any questions you have with your health care provider. Document Released: 09/05/2003 Document Revised: 11/21/2015 Document Reviewed: 11/04/2012 Elsevier Interactive Patient Education  2017 [place] Anesthesia, Adult General anesthesia is the use of medicines to make a person "go to sleep" (be unconscious) for a medical procedure. General anesthesia is often recommended when a procedure:  Is long.  Requires you to be still or in an unusual position.  Is major and can cause you to lose blood.  Is impossible to do without general anesthesia.  The medicines used for general anesthesia are called general anesthetics. In addition to making you sleep, the medicines:  Prevent pain.  Control your blood pressure.  Relax your muscles.  Tell a  health care provider about:  Any allergies you have.  All medicines you are taking, including vitamins, herbs, eye drops, creams, and over-the-counter medicines.  Any problems you or family members have had with anesthetic medicines.  Types of anesthetics you have had in the past.  Any bleeding disorders you have.  Any surgeries you have had.  Any medical conditions you have.  Any history of heart or lung conditions, such as heart failure, sleep apnea, or chronic obstructive pulmonary disease (COPD).  Whether you are pregnant or may be pregnant.  Whether you use tobacco, alcohol, marijuana, or street drugs.  Any history of Armed forces logistics/support/administrative officer.  Any history of depression or anxiety. What are the risks? Generally, this is a safe procedure. However, problems may occur, including:  Allergic reaction to anesthetics.  Lung and heart problems.  Inhaling food or liquids from your stomach into your lungs (aspiration).  Injury to nerves.  Waking up during your procedure and being unable to move (rare).  Extreme agitation or a state of mental confusion (delirium) when you wake up from the anesthetic.  Air in the bloodstream, which can lead to stroke.  These problems are more likely to develop if you are having a major surgery or if you have an advanced medical condition. You can prevent some of these complications by answering all of your health care provider's questions thoroughly and by following all pre-procedure instructions. General anesthesia can cause side effects, including:  Nausea or vomiting  A sore throat from the breathing tube.  Feeling cold or shivery.  Feeling tired, washed out, or achy.  Sleepiness or drowsiness.  Confusion or agitation.  What happens before the procedure? Staying hydrated Follow instructions from your health care provider about hydration, which may include:  Up to 2 hours before the procedure - you may continue to drink clear liquids,  such as water, clear fruit juice, black coffee, and plain tea.  Eating and drinking restrictions Follow instructions from your health care provider about eating and drinking, which may include:  8 hours before the procedure - stop eating heavy meals or foods such as meat, fried foods, or fatty foods.  6 hours before the procedure - stop eating light meals or foods, such as toast or cereal.  6 hours before the procedure - stop drinking milk or drinks that contain milk.  2 hours before the procedure - stop drinking clear liquids.  Medicines  Ask your health care provider about: ? Changing or stopping your regular medicines. This is especially important if you are taking diabetes medicines or blood thinners. ? Taking medicines such as aspirin and ibuprofen. These medicines can thin your blood. Do not take these medicines before your procedure if your health care provider instructs you not to. ? Taking new dietary supplements or medicines. Do not take these during the week before your procedure unless your health care provider approves them.  If you are told to take a medicine or to continue taking a medicine on the day of the procedure, take the medicine with sips of water. General instructions   Ask if you will be going home the same day, the following day, or after a longer hospital stay. ? Plan to have someone take you home. ? Plan to have someone stay with you for the first 24 hours after you leave the hospital or clinic.  For 3-6 weeks before the procedure, try not to use any tobacco products, such as cigarettes, chewing tobacco, and e-cigarettes.  You may brush your teeth on the morning of the procedure, but make sure to spit out the toothpaste. What happens during the procedure?  You will be given anesthetics through a mask and through an IV tube in one of your veins.  You may receive medicine to  help you relax (sedative).  As soon as you are asleep, a breathing tube may be  used to help you breathe.  An anesthesia specialist will stay with you throughout the procedure. He or she will help keep you comfortable and safe by continuing to give you medicines and adjusting the amount of medicine that you get. He or she will also watch your blood pressure, pulse, and oxygen levels to make sure that the anesthetics do not cause any problems.  If a breathing tube was used to help you breathe, it will be removed before you wake up. The procedure may vary among health care providers and hospitals. What happens after the procedure?  You will wake up, often slowly, after the procedure is complete, usually in a recovery area.  Your blood pressure, heart rate, breathing rate, and blood oxygen level will be monitored until the medicines you were given have worn off.  You may be given medicine to help you calm down if you feel anxious or agitated.  If you will be going home the same day, your health care provider may check to make sure you can stand, drink, and urinate.  Your health care providers will treat your pain and side effects before you go home.  Do not drive for 24 hours if you received a sedative.  You may: ? Feel nauseous and vomit. ? Have a sore throat. ? Have mental slowness. ? Feel cold or shivery. ? Feel sleepy. ? Feel tired. ? Feel sore or achy, even in parts of your body where you did not have surgery. This information is not intended to replace advice given to you by your health care provider. Make sure you discuss any questions you have with your health care provider. Document Released: 09/22/2007 Document Revised: 11/26/2015 Document Reviewed: 05/30/2015 Elsevier Interactive Patient Education  2018 [place] Anesthesia, Adult, Care After These instructions provide you with information about caring for yourself after your procedure. Your health care provider may also give you more specific instructions. Your treatment has been planned  according to current medical practices, but problems sometimes occur. Call your health care provider if you have any problems or questions after your procedure. What can I expect after the procedure? After the procedure, it is common to have:  Vomiting.  A sore throat.  Mental slowness.  It is common to feel:  Nauseous.  Cold or shivery.  Sleepy.  Tired.  Sore or achy, even in parts of your body where you did not have surgery.  Follow these instructions at home: For at least 24 hours after the procedure:  Do not: ? Participate in activities where you could fall or become injured. ? Drive. ? Use heavy machinery. ? Drink alcohol. ? Take sleeping pills or medicines that cause drowsiness. ? Make important decisions or sign legal documents. ? Take care of children on your own.  Rest. Eating and drinking  If you vomit, drink water, juice, or soup when you can drink without vomiting.  Drink enough fluid to keep your urine clear or pale yellow.  Make sure you have little or no nausea before eating solid foods.  Follow the diet recommended by your health care provider. General instructions  Have a responsible adult stay with you until you are awake and alert.  Return to your normal activities as told by your health care provider. Ask your health care provider what activities are safe for you.  Take over-the-counter and prescription medicines only as told by  your health care provider.  If you smoke, do not smoke without supervision.  Keep all follow-up visits as told by your health care provider. This is important. Contact a health care provider if:  You continue to have nausea or vomiting at home, and medicines are not helpful.  You cannot drink fluids or start eating again.  You cannot urinate after 8-12 hours.  You develop a skin rash.  You have fever.  You have increasing redness at the site of your procedure. Get help right away if:  You have  difficulty breathing.  You have chest pain.  You have unexpected bleeding.  You feel that you are having a life-threatening or urgent problem. This information is not intended to replace advice given to you by your health care provider. Make sure you discuss any questions you have with your health care provider. Document Released: 09/21/2000 Document Revised: 11/18/2015 Document Reviewed: 05/30/2015 Elsevier Interactive Patient Education  [employer].

## 2018-04-06 ENCOUNTER — Ambulatory Visit: Payer: BC Managed Care – PPO | Admitting: Obstetrics and Gynecology

## 2018-04-06 ENCOUNTER — Other Ambulatory Visit: Payer: Self-pay

## 2018-04-06 ENCOUNTER — Encounter: Payer: Self-pay | Admitting: Obstetrics and Gynecology

## 2018-04-06 VITALS — BP 131/74 | HR 86 | Ht 61.0 in | Wt 183.0 lb

## 2018-04-06 DIAGNOSIS — N9412 Deep dyspareunia: Secondary | ICD-10-CM

## 2018-04-06 DIAGNOSIS — B373 Candidiasis of vulva and vagina: Secondary | ICD-10-CM | POA: Diagnosis not present

## 2018-04-06 DIAGNOSIS — B3731 Acute candidiasis of vulva and vagina: Secondary | ICD-10-CM

## 2018-04-06 DIAGNOSIS — Z113 Encounter for screening for infections with a predominantly sexual mode of transmission: Secondary | ICD-10-CM

## 2018-04-06 MED ORDER — FLUCONAZOLE 150 MG PO TABS: 150.0000 mg | ORAL_TABLET | Freq: Once | ORAL | 1 refills | Status: AC

## 2018-04-06 NOTE — Progress Notes (Signed)
Patient ID: Jenny Brown, female   DOB: 1971/06/10, 47 y.o.   MRN: [id number]  Preoperative History and Physical--surgery may need to be postponed  Jenny Brown is a 47 y.o. [id number] here for surgical management of uterine retroversion and rectocele. Significant preoperative concerns.   She is upset that workers compensation and is unsure if it will come through in time for the surgery. She fell down school bus steps and hit her back and had muscles spasms, it is believes that she has a pinched nerve and numbness and tingling in her legs. It is a burning sensation in her butt and down her legs. She fell 9/19 and back problems to have gotten worse after she came in for her ultrasound at the office. She sees a spinal specialist 04/20/18  Proposed surgery: Vaginal hysterectomy with posterior repair  Past Medical History:  Diagnosis Date  . Essential hypertension, benign   . Hypertension   . OSA (obstructive sleep apnea)   . Restless legs syndrome   . Seizures ([place])    Past Surgical History:  Procedure Laterality Date  . DILITATION & CURRETTAGE/HYSTROSCOPY WITH NOVASURE ABLATION N/A 07/01/2017   Procedure: DILATATION & CURETTAGE/HYSTEROSCOPY WITH NOVASURE ABLATION;  Surgeon: [name], MD;  Location: [facility];  Service: Gynecology;  Laterality: N/A;  dr. requests 10:00 start  . TUBAL LIGATION     OB History  Gravida Para Term Preterm AB Living  2 2 2     2   SAB TAB Ectopic Multiple Live Births          2    # Outcome Date GA Lbr Len/2nd Weight Sex Delivery Anes PTL Lv  2 Term     F Vag-Spont   LIV  1 Term     M Vag-Spont   LIV  Patient denies any other pertinent gynecologic issues.   Current Outpatient Medications on File Prior to Visit  Medication Sig Dispense Refill  . lisinopril-hydrochlorothiazide (PRINZIDE,ZESTORETIC) 20-25 MG tablet TAKE 1 TABLET BY MOUTH ONCE DAILY (NEEDS  TO  BE  SEEN  FOR  REFILLS) (Patient taking differently: Take 1 tablet by mouth daily. ) 90 tablet 0  .  sertraline (ZOLOFT) 50 MG tablet TAKE 1 TABLET BY MOUTH ONCE DAILY (Patient taking differently: Take 50 mg by mouth 2 (two) times a week. ) 90 tablet 0   Current Facility-Administered Medications on File Prior to Visit  Medication Dose Route Frequency Provider Last Rate Last Dose  . miconazole (MONISTAT 7) 2 % vaginal cream 1 Applicatorful  1 Applicatorful Vaginal QHS [name], MD       No Known Allergies  Social History:   reports that she has never smoked. She has never used smokeless tobacco. She reports that she does not drink alcohol or use drugs.  Family History  Problem Relation Age of Onset  . Heart disease Mother   . Diabetes Mother   . Dementia Mother   . Diabetes Father   . Heart disease Father   . Heart disease Paternal Grandfather   . Heart disease Paternal Grandmother   . Dementia Maternal Grandmother   . Heart disease Maternal Grandfather     Review of Systems: Noncontributory  PHYSICAL EXAM: There were no vitals taken for this visit. General appearance - alert, well appearing, and in no distress Chest - clear to auscultation, no wheezes, rales or rhonchi, symmetric air entry Heart - normal rate and regular rhythm Abdomen - soft, nontender, nondistended, no masses or organomegaly Pelvic -  VAGINA: Relaxed posterior wall with rectocele visibly present.  Patient refuses digital rectal.  Prior exam showed rectocele to greater than 90 degrees CERVIX: Multiparous midline uterine descensus first-degree UTERUS: Tender to contact mid position uterus first-degree uterine descensus WET MOUNT: Pos for yeast Extremities - peripheral pulses normal, no pedal edema, no clubbing or cyanosis  Labs: No results found for this or any previous visit (from the past 336 hour(s)).  Imaging Studies: US Transvaginal Non-ob  Result Date: 03/22/2018 GYNECOLOGIC SONOGRAM Jenny Brown is a 47 y.o. [id number] s/p ablation,she is here for a pelvic sonogram for pelvic pain. Uterus                    6.9 x 5.7 x 5.9 cm, vol 121 ml, homogeneous retroverted uterus,wnl Endometrium          9.1 mm, symmetrical, wnl Right ovary             2.9 x 1.6 x 2.1 cm, wnl Left ovary                5.5 x 2.8 x 3.6 cm, two simple left ovarian cysts (#1) 3.1 x 2.7 x 2.5 cm,(#2) 2.1 x 2 x 2.3 cm small amount of simple right adnexal Technician Comments: PELVIC US TA/TV: homogeneous retroverted uterus,wnl,EEC 9.1 mm,normal right ovary,small amount of simple right adnexal fluid,two simple left ovarian cysts (#1) 3.1 x 2.7 x 2.5 cm,(#2) 2.1 x 2 x 2.3 cm,ovaries appear mobile,bilat adnexal pain during ultrasound [employer] 03/21/2018 4:36 PM Clinical Impression and recommendations: I have reviewed the sonogram results above.followup u/s after 1 yr endometrial ablation. Known rectocele, pelvic pain and dyspareunia. Combined with the patient's current clinical course, below are my impressions and any appropriate recommendations for management based on the sonographic findings: 1.  Small retroverted uterus. With uterine contact to cervix reproducing the pelvic pain of dypareunia. 2. Thin endometrium. 3 Small simple cysts bilateral larger on left, no internal solid component or doppler flow. Benign characteristics. [name]   US Pelvis Complete  Result Date: 03/22/2018 GYNECOLOGIC SONOGRAM Jenny Brown is a 47 y.o. [phone] s/p ablation,she is here for a pelvic sonogram for pelvic pain. Uterus                   6.9 x 5.7 x 5.9 cm, vol 121 ml, homogeneous retroverted uterus,wnl Endometrium          9.1 mm, symmetrical, wnl Right ovary             2.9 x 1.6 x 2.1 cm, wnl Left ovary                5.5 x 2.8 x 3.6 cm, two simple left ovarian cysts (#1) 3.1 x 2.7 x 2.5 cm,(#2) 2.1 x 2 x 2.3 cm small amount of simple right adnexal Technician Comments: PELVIC US TA/TV: homogeneous retroverted uterus,wnl,EEC 9.1 mm,normal right ovary,small amount of simple right adnexal fluid,two simple left ovarian cysts (#1) 3.1 x 2.7 x  2.5 cm,(#2) 2.1 x 2 x 2.3 cm,ovaries appear mobile,bilat adnexal pain during ultrasound [employer] 03/21/2018 4:36 PM Clinical Impression and recommendations: I have reviewed the sonogram results above.followup u/s after 1 yr endometrial ablation. Known rectocele, pelvic pain and dyspareunia. Combined with the patient's current clinical course, below are my impressions and any appropriate recommendations for management based on the sonographic findings: 1.  Small retroverted uterus. With uterine contact to cervix reproducing the pelvic pain of dypareunia. 2. Thin  endometrium. 3 Small simple cysts bilateral larger on left, no internal solid component or doppler flow. Benign characteristics. [name]    Assessment: Yeast infection Patient Active Problem List   Diagnosis Date Noted  . Female dyspareunia 03/22/2018  . Abnormal uterine bleeding (AUB) 07/01/2017  . Rectocele 07/01/2017  . BPPV (benign paroxysmal positional vertigo) 07/14/2016  . Numbness and tingling of right arm and leg 05/14/2016  . Acute bilateral low back pain with bilateral sciatica 05/14/2016  . Postconcussive syndrome 04/23/2016  . Anemia 04/23/2016  . Concussion with loss of consciousness   . Generalized weakness   . Spells of decreased attentiveness   . Insomnia 12/26/2015  . Precordial pain 03/19/2014  . Essential hypertension, benign 03/19/2014  . OSA (obstructive sleep apnea) 03/19/2014    Plan: Due to [facility] [employer] Compensation evaluation scheduled for October 23 will need to postpone surgery until she is been seen.  Patient will undergo surgical management with vaginal hysterectomy with posterior repair, once back evaluation has progressed.  I sent a note with the patient to her spinal surgeon for their opinion on what I can proceed with surgery Postpone Surgery until after initial evaluation with Spinal specialist.  Guarding for Workmen's Comp. evaluation   By signing my name below, I, Samul Dada, attest  that this documentation has been prepared under the direction and in the presence of [name], MD. Electronically Signed: [place]. 04/06/18. 2:47 PM.  I personally performed the services described in this documentation, which was SCRIBED in my presence. The recorded information has been reviewed and considered accurate. It has been edited as necessary during review. [name], MD

## 2018-04-07 ENCOUNTER — Encounter (HOSPITAL_COMMUNITY)
Admission: RE | Admit: 2018-04-07 | Discharge: 2018-04-07 | Disposition: A | Discharge status: Home / Self Care | Payer: BC Managed Care – PPO | Source: Ambulatory Visit | Attending: Obstetrics and Gynecology | Admitting: Obstetrics and Gynecology

## 2018-04-07 ENCOUNTER — Encounter (HOSPITAL_COMMUNITY): Payer: Self-pay

## 2018-04-08 LAB — GC/CHLAMYDIA PROBE AMP
Chlamydia trachomatis, NAA: NEGATIVE
Neisseria gonorrhoeae by PCR: NEGATIVE

## 2018-04-12 ENCOUNTER — Encounter (HOSPITAL_COMMUNITY): Admission: RE | Payer: Self-pay | Source: Ambulatory Visit

## 2018-04-12 ENCOUNTER — Ambulatory Visit (HOSPITAL_COMMUNITY)
Admission: RE | Admit: 2018-04-12 | Payer: BC Managed Care – PPO | Source: Ambulatory Visit | Admitting: Obstetrics and Gynecology

## 2018-04-12 SURGERY — HYSTERECTOMY, VAGINAL
Anesthesia: General

## 2018-04-20 ENCOUNTER — Encounter: Payer: BC Managed Care – PPO | Admitting: Obstetrics and Gynecology

## 2018-06-27 ENCOUNTER — Telehealth: Payer: Self-pay | Admitting: Pediatrics

## 2018-06-27 MED ORDER — LISINOPRIL-HYDROCHLOROTHIAZIDE 20-25 MG PO TABS: ORAL_TABLET | ORAL | 0 refills | Status: DC

## 2018-06-27 NOTE — Telephone Encounter (Signed)
What is the name of the medication? Lisinopril  Have you contacted your pharmacy to request a refill? no  Which pharmacy would you like this sent to? [facility] [place] has been about for about a month has appt on 07/12/2018 wants enough until then    Patient notified that their request is being sent to the clinical staff for review and that they should receive a call once it is complete. If they do not receive a call within 24 hours they can check with their pharmacy or our office.

## 2018-06-27 NOTE — Telephone Encounter (Signed)
Last visit was 03-03-2017.  Scheduled for January. Please advise in lisinopril refill.

## 2018-06-30 NOTE — Telephone Encounter (Signed)
Left message that rx sent into pharmacy and to call back with any further questions or concerns.

## 2018-07-12 ENCOUNTER — Ambulatory Visit: Payer: BC Managed Care – PPO | Admitting: Pediatrics

## 2018-07-22 ENCOUNTER — Ambulatory Visit: Payer: BC Managed Care – PPO | Admitting: Pediatrics

## 2018-07-22 ENCOUNTER — Encounter: Payer: Self-pay | Admitting: Family Medicine

## 2018-07-22 ENCOUNTER — Ambulatory Visit: Payer: BC Managed Care – PPO | Admitting: Family Medicine

## 2018-07-22 VITALS — BP 158/95 | HR 63 | Temp 97.3°F | Ht 61.0 in | Wt 185.0 lb

## 2018-07-22 DIAGNOSIS — F32A Depression, unspecified: Secondary | ICD-10-CM | POA: Insufficient documentation

## 2018-07-22 DIAGNOSIS — I10 Essential (primary) hypertension: Secondary | ICD-10-CM | POA: Diagnosis not present

## 2018-07-22 DIAGNOSIS — F339 Major depressive disorder, recurrent, unspecified: Secondary | ICD-10-CM

## 2018-07-22 DIAGNOSIS — R921 Mammographic calcification found on diagnostic imaging of breast: Secondary | ICD-10-CM | POA: Insufficient documentation

## 2018-07-22 DIAGNOSIS — F329 Major depressive disorder, single episode, unspecified: Secondary | ICD-10-CM | POA: Insufficient documentation

## 2018-07-22 MED ORDER — SERTRALINE HCL 50 MG PO TABS: 50.0000 mg | ORAL_TABLET | Freq: Every day | ORAL | 0 refills | Status: DC

## 2018-07-22 MED ORDER — LISINOPRIL-HYDROCHLOROTHIAZIDE 20-25 MG PO TABS: ORAL_TABLET | ORAL | 0 refills | Status: DC

## 2018-07-22 NOTE — Patient Instructions (Signed)
DASH Eating Plan  DASH stands for "Dietary Approaches to Stop Hypertension." The DASH eating plan is a healthy eating plan that has been shown to reduce high blood pressure (hypertension). It may also reduce your risk for type 2 diabetes, heart disease, and stroke. The DASH eating plan may also help with weight loss.  What are tips for following this plan?    General guidelines   Avoid eating more than 2,300 mg (milligrams) of salt (sodium) a day. If you have hypertension, you may need to reduce your sodium intake to 1,500 mg a day.   Limit alcohol intake to no more than 1 drink a day for nonpregnant women and 2 drinks a day for men. One drink equals 12 oz of beer, 5 oz of wine, or 1 oz of hard liquor.   Work with your health care provider to maintain a healthy body weight or to lose weight. Ask what an ideal weight is for you.   Get at least 30 minutes of exercise that causes your heart to beat faster (aerobic exercise) most days of the week. Activities may include walking, swimming, or biking.   Work with your health care provider or diet and nutrition specialist (dietitian) to adjust your eating plan to your individual calorie needs.  Reading food labels     Check food labels for the amount of sodium per serving. Choose foods with less than 5 percent of the Daily Value of sodium. Generally, foods with less than 300 mg of sodium per serving fit into this eating plan.   To find whole grains, look for the word "whole" as the first word in the ingredient list.  Shopping   Buy products labeled as "low-sodium" or "no salt added."   Buy fresh foods. Avoid canned foods and premade or frozen meals.  Cooking   Avoid adding salt when cooking. Use salt-free seasonings or herbs instead of table salt or sea salt. Check with your health care provider or pharmacist before using salt substitutes.   Do not fry foods. Cook foods using healthy methods such as baking, boiling, grilling, and broiling instead.   Cook with  heart-healthy oils, such as olive, canola, soybean, or sunflower oil.  Meal planning   Eat a balanced diet that includes:  ? 5 or more servings of fruits and vegetables each day. At each meal, try to fill half of your plate with fruits and vegetables.  ? Up to 6-8 servings of whole grains each day.  ? Less than 6 oz of lean meat, poultry, or fish each day. A 3-oz serving of meat is about the same size as a deck of cards. One egg equals 1 oz.  ? 2 servings of low-fat dairy each day.  ? A serving of nuts, seeds, or beans 5 times each week.  ? Heart-healthy fats. Healthy fats called Omega-3 fatty acids are found in foods such as flaxseeds and coldwater fish, like sardines, salmon, and mackerel.   Limit how much you eat of the following:  ? Canned or prepackaged foods.  ? Food that is high in trans fat, such as fried foods.  ? Food that is high in saturated fat, such as fatty meat.  ? Sweets, desserts, sugary drinks, and other foods with added sugar.  ? Full-fat dairy products.   Do not salt foods before eating.   Try to eat at least 2 vegetarian meals each week.   Eat more home-cooked food and less restaurant, buffet, and fast food.     When eating at a restaurant, ask that your food be prepared with less salt or no salt, if possible.  What foods are recommended?  The items listed may not be a complete list. Talk with your dietitian about what dietary choices are best for you.  Grains  Whole-grain or whole-wheat bread. Whole-grain or whole-wheat pasta. Rittenberry rice. Oatmeal. Quinoa. Bulgur. Whole-grain and low-sodium cereals. Pita bread. Low-fat, low-sodium crackers. Whole-wheat flour tortillas.  Vegetables  Fresh or frozen vegetables (raw, steamed, roasted, or grilled). Low-sodium or reduced-sodium tomato and vegetable juice. Low-sodium or reduced-sodium tomato sauce and tomato paste. Low-sodium or reduced-sodium canned vegetables.  Fruits  All fresh, dried, or frozen fruit. Canned fruit in natural juice (without  added sugar).  Meat and other protein foods  Skinless chicken or turkey. Ground chicken or turkey. Pork with fat trimmed off. Fish and seafood. Egg whites. Dried beans, peas, or lentils. Unsalted nuts, nut butters, and seeds. Unsalted canned beans. Lean cuts of beef with fat trimmed off. Low-sodium, lean deli meat.  Dairy  Low-fat (1%) or fat-free (skim) milk. Fat-free, low-fat, or reduced-fat cheeses. Nonfat, low-sodium ricotta or cottage cheese. Low-fat or nonfat yogurt. Low-fat, low-sodium cheese.  Fats and oils  Soft margarine without trans fats. Vegetable oil. Low-fat, reduced-fat, or light mayonnaise and salad dressings (reduced-sodium). Canola, safflower, olive, soybean, and sunflower oils. Avocado.  Seasoning and other foods  Herbs. Spices. Seasoning mixes without salt. Unsalted popcorn and pretzels. Fat-free sweets.  What foods are not recommended?  The items listed may not be a complete list. Talk with your dietitian about what dietary choices are best for you.  Grains  Baked goods made with fat, such as croissants, muffins, or some breads. Dry pasta or rice meal packs.  Vegetables  Creamed or fried vegetables. Vegetables in a cheese sauce. Regular canned vegetables (not low-sodium or reduced-sodium). Regular canned tomato sauce and paste (not low-sodium or reduced-sodium). Regular tomato and vegetable juice (not low-sodium or reduced-sodium). Pickles. Olives.  Fruits  Canned fruit in a light or heavy syrup. Fried fruit. Fruit in cream or butter sauce.  Meat and other protein foods  Fatty cuts of meat. Ribs. Fried meat. Bacon. Sausage. Bologna and other processed lunch meats. Salami. Fatback. Hotdogs. Bratwurst. Salted nuts and seeds. Canned beans with added salt. Canned or smoked fish. Whole eggs or egg yolks. Chicken or turkey with skin.  Dairy  Whole or 2% milk, cream, and half-and-half. Whole or full-fat cream cheese. Whole-fat or sweetened yogurt. Full-fat cheese. Nondairy creamers. Whipped toppings.  Processed cheese and cheese spreads.  Fats and oils  Butter. Stick margarine. Lard. Shortening. Ghee. Bacon fat. Tropical oils, such as coconut, palm kernel, or palm oil.  Seasoning and other foods  Salted popcorn and pretzels. Onion salt, garlic salt, seasoned salt, table salt, and sea salt. Worcestershire sauce. Tartar sauce. Barbecue sauce. Teriyaki sauce. Soy sauce, including reduced-sodium. Steak sauce. Canned and packaged gravies. Fish sauce. Oyster sauce. Cocktail sauce. Horseradish that you find on the shelf. Ketchup. Mustard. Meat flavorings and tenderizers. Bouillon cubes. Hot sauce and Tabasco sauce. Premade or packaged marinades. Premade or packaged taco seasonings. Relishes. Regular salad dressings.  Where to find more information:   National Heart, Lung, and Blood Institute: www.nhlbi.nih.gov   American Heart Association: www.heart.org  Summary   The DASH eating plan is a healthy eating plan that has been shown to reduce high blood pressure (hypertension). It may also reduce your risk for type 2 diabetes, heart disease, and stroke.   With the   DASH eating plan, you should limit salt (sodium) intake to 2,300 mg a day. If you have hypertension, you may need to reduce your sodium intake to 1,500 mg a day.   When on the DASH eating plan, aim to eat more fresh fruits and vegetables, whole grains, lean proteins, low-fat dairy, and heart-healthy fats.   Work with your health care provider or diet and nutrition specialist (dietitian) to adjust your eating plan to your individual calorie needs.  This information is not intended to replace advice given to you by your health care provider. Make sure you discuss any questions you have with your health care provider.  Document Released: 06/04/2011 Document Revised: 06/08/2016 Document Reviewed: 06/08/2016  Elsevier Interactive Patient Education  2019 [employer]

## 2018-07-22 NOTE — Progress Notes (Signed)
Subjective:    Patient ID: Jenny Brown, female    DOB: March 10, 1971, 48 y.o.   MRN: [id number]  Chief Complaint:  Medical Management of Chronic Issues (out of Zoloft x 2 months, wants to go back on medication)   HPI: Jenny Brown is a 48 y.o. female presenting on 07/22/2018 for Medical Management of Chronic Issues (out of Zoloft x 2 months, wants to go back on medication)   1. Essential hypertension, benign  Complaint with meds - Yes, but was without medications for at least 2 months, has been back on for 2 weeks Checking BP at home ranging 160's / 90's Exercising Regularly - Yes Watching Salt intake - No Pertinent ROS:  Headache - No Chest pain - Had chest pain over the weekend, unsure if related to anxiety, it lasted for a short time and has not returned. No shortness of breath, diaphoresis, nausea, or radiation of the pain.  Dyspnea - No Palpitations - No LE edema - No  BP Readings from Last 3 Encounters:  07/22/18 (!) 158/95  04/06/18 131/74  03/21/18 133/82     2. Depression, recurrent ([place])  Has been out of Zoloft for about 2 months. States she has had increased depression and lack of energy since this time. States she can tell a big difference since stopping the medications. She denies SI or HI. She has not seen a counselor in the past. States she may be open to this if she does not start feeling better back on the medications.   Depression screen [facility] 2/9 07/22/2018 03/07/2018 03/03/2017 12/07/2016 07/14/2016  Decreased Interest '2 2 3 '$ 0 1  Down, Depressed, Hopeless '2 3 3 '$ 0 1  PHQ - 2 Score '4 5 6 '$ 0 2  Altered sleeping '3 3 3 '$ - 2  Tired, decreased energy '3 3 3 '$ - 2  Change in appetite '3 3 3 '$ - 2  Feeling bad or failure about yourself  '3 2 3 '$ - 1  Trouble concentrating '3 2 3 '$ - 2  Moving slowly or fidgety/restless '2 2 2 '$ - 2  Suicidal thoughts 0 1 1 - 1  PHQ-9 Score '21 21 24 '$ - 14  Difficult doing work/chores - Very difficult - - Somewhat difficult        Relevant past  medical, surgical, family, and social history reviewed and updated as indicated.  Allergies and medications reviewed and updated.   Past Medical History:  Diagnosis Date  . Essential hypertension, benign   . Hypertension   . OSA (obstructive sleep apnea)   . Restless legs syndrome   . Seizures ([place])     Past Surgical History:  Procedure Laterality Date  . DILITATION & CURRETTAGE/HYSTROSCOPY WITH NOVASURE ABLATION N/A 07/01/2017   Procedure: DILATATION & CURETTAGE/HYSTEROSCOPY WITH NOVASURE ABLATION;  Surgeon: [name], MD;  Location: [facility];  Service: Gynecology;  Laterality: N/A;  dr. requests 10:00 start  . TUBAL LIGATION      Social History   Socioeconomic History  . Marital status: Married    Spouse name: Not on file  . Number of children: Not on file  . Years of education: Not on file  . Highest education level: Not on file  Occupational History  . Not on file  Social Needs  . Financial resource strain: Not on file  . Food insecurity:    Worry: Not on file    Inability: Not on file  . Transportation needs:    Medical: Not on file  Non-medical: Not on file  Tobacco Use  . Smoking status: Never Smoker  . Smokeless tobacco: Never Used  Substance and Sexual Activity  . Alcohol use: No  . Drug use: No  . Sexual activity: Yes    Birth control/protection: Surgical    Comment: tubal and ablation  Lifestyle  . Physical activity:    Days per week: Not on file    Minutes per session: Not on file  . Stress: Not on file  Relationships  . Social connections:    Talks on phone: Not on file    Gets together: Not on file    Attends religious service: Not on file    Active member of club or organization: Not on file    Attends meetings of clubs or organizations: Not on file    Relationship status: Not on file  . Intimate partner violence:    Fear of current or ex partner: Not on file    Emotionally abused: Not on file    Physically abused: Not on file     Forced sexual activity: Not on file  Other Topics Concern  . Not on file  Social History Narrative   ** Merged History Encounter **       Lives with husband and 2 children in a one story home.  Works as a Optometrist.     Outpatient Encounter Medications as of 07/22/2018  Medication Sig  . lisinopril-hydrochlorothiazide (PRINZIDE,ZESTORETIC) 20-25 MG tablet TAKE 1 TABLET BY MOUTH ONCE DAILY (NEEDS  TO  BE  SEEN  FOR  REFILLS)  . sertraline (ZOLOFT) 50 MG tablet Take 1 tablet (50 mg total) by mouth at bedtime.  . [DISCONTINUED] lisinopril-hydrochlorothiazide (PRINZIDE,ZESTORETIC) 20-25 MG tablet TAKE 1 TABLET BY MOUTH ONCE DAILY (NEEDS&nbsp;&nbsp;TO&nbsp;&nbsp;BE&nbsp;&nbsp;SEEN&nbsp;&nbsp;FOR&nbsp;&nbsp;REFILLS)  . [DISCONTINUED] sertraline (ZOLOFT) 50 MG tablet TAKE 1 TABLET BY MOUTH ONCE DAILY (Patient taking differently: Take 50 mg by mouth 2 (two) times a week. )  . [DISCONTINUED] sertraline (ZOLOFT) 50 MG tablet Take 50 mg by mouth daily.   Facility-Administered Encounter Medications as of 07/22/2018  Medication  . miconazole (MONISTAT 7) 2 % vaginal cream 1 Applicatorful    No Known Allergies  Review of Systems  Constitutional: Positive for activity change, appetite change and fatigue. Negative for chills and fever.  Respiratory: Negative for cough, chest tightness and shortness of breath.   Cardiovascular: Positive for chest pain. Negative for palpitations and leg swelling.  Gastrointestinal: Negative for abdominal pain.  Musculoskeletal: Negative for arthralgias, gait problem, neck pain and neck stiffness.  Neurological: Positive for dizziness (history of vertigo, intermittent). Negative for tremors, seizures, syncope, facial asymmetry, speech difficulty, weakness, light-headedness, numbness and headaches.  Psychiatric/Behavioral: Positive for decreased concentration, dysphoric mood and sleep disturbance. Negative for agitation, behavioral problems, confusion,  hallucinations, self-injury and suicidal ideas. The patient is nervous/anxious. The patient is not hyperactive.   All other systems reviewed and are negative.       Objective:    BP (!) 158/95   Pulse 63   Temp (!) 97.3 F (36.3 C) (Oral)   Ht '5\' 1"'$  (1.549 m)   Wt 185 lb (83.9 kg)   BMI 34.96 kg/m    Wt Readings from Last 3 Encounters:  07/22/18 185 lb (83.9 kg)  04/06/18 183 lb (83 kg)  03/07/18 180 lb 12.8 oz (82 kg)    Physical Exam Vitals signs and nursing note reviewed.  Constitutional:      General: She is not in acute distress.  Appearance: Normal appearance. She is well-developed and well-groomed. She is obese. She is not ill-appearing or toxic-appearing.  HENT:     Head: Normocephalic and atraumatic.     Right Ear: Hearing, tympanic membrane, ear canal and external ear normal.     Left Ear: Hearing, tympanic membrane, ear canal and external ear normal.     Nose: Nose normal.     Mouth/Throat:     Lips: Pink.     Mouth: Mucous membranes are moist.     Pharynx: Oropharynx is clear. Uvula midline.  Eyes:     General: Lids are normal.     Extraocular Movements: Extraocular movements intact.     Conjunctiva/sclera: Conjunctivae normal.     Pupils: Pupils are equal, round, and reactive to light.     Visual Fields: Right eye visual fields normal and left eye visual fields normal.  Neck:     Musculoskeletal: Normal range of motion and neck supple. No neck rigidity or muscular tenderness.     Vascular: No carotid bruit or JVD.     Trachea: Trachea and phonation normal.  Cardiovascular:     Rate and Rhythm: Normal rate and regular rhythm.     Chest Wall: PMI is not displaced.     Pulses: Normal pulses.     Heart sounds: Normal heart sounds. No murmur. No friction rub. No gallop.   Pulmonary:     Effort: Pulmonary effort is normal.     Breath sounds: Normal breath sounds.  Abdominal:     General: Abdomen is flat. Bowel sounds are normal.     Tenderness: There  is no abdominal tenderness. There is no right CVA tenderness or left CVA tenderness.  Lymphadenopathy:     Cervical: No cervical adenopathy.  Skin:    General: Skin is warm and dry.     Capillary Refill: Capillary refill takes less than 2 seconds.  Neurological:     General: No focal deficit present.     Mental Status: She is alert and oriented to person, place, and time.     Cranial Nerves: Cranial nerves are intact.     Sensory: Sensation is intact.     Motor: Motor function is intact.     Coordination: Coordination is intact.     Gait: Gait is intact.     Deep Tendon Reflexes: Reflexes are normal and symmetric.  Psychiatric:        Attention and Perception: Attention and perception normal.        Mood and Affect: Mood and affect normal.        Speech: Speech normal.        Behavior: Behavior normal. Behavior is cooperative.        Thought Content: Thought content normal. Thought content is not paranoid or delusional. Thought content does not include homicidal or suicidal ideation. Thought content does not include homicidal or suicidal plan.        Cognition and Memory: Cognition and memory normal.        Judgment: Judgment normal.     Results for orders placed or performed in visit on 04/06/18  GC/Chlamydia Probe Amp  Result Value Ref Range   Chlamydia trachomatis, NAA Negative Negative   Neisseria gonorrhoeae by PCR Negative Negative       Pertinent labs & imaging results that were available during my care of the patient were reviewed by me and considered in my medical decision making.  Assessment & Plan:  Rateel was seen today  for medical management of chronic issues.  Diagnoses and all orders for this visit:  Essential hypertension, benign Labs pending. Pt to keep a blood pressure log and report if blood pressure remains elevated. May need to increase medications dose if labs normal. Report any new or worsening symptoms. Medications as prescribed. Return in 4 weeks for  reevaluation.  -     lisinopril-hydrochlorothiazide (PRINZIDE,ZESTORETIC) 20-25 MG tablet; TAKE 1 TABLET BY MOUTH ONCE DAILY (NEEDS  TO  BE  SEEN  FOR  REFILLS) -     CMP14+EGFR -     Lipid panel -     TSH -     CBC with Differential/Platelet -     Microalbumin / creatinine urine ratio  Depression, recurrent (H[facility]) Will restart Zoloft. Pt considering counseling. Return in 4 weeks for reevaluation. Report any new or worsening symptoms. Medications as prescribed.  -     sertraline (ZOLOFT) 50 MG tablet; Take 1 tablet (50 mg total) by mouth at bedtime. -     TSH     Continue all other maintenance medications.  Follow up plan: Return in about 1 month (around 08/22/2018), or if symptoms worsen or fail to improve.  Educational handout given for DASH diet  The above assessment and management plan was discussed with the patient. The patient verbalized understanding of and has agreed to the management plan. Patient is aware to call the clinic if symptoms persist or worsen. Patient is aware when to return to the clinic for a follow-up visit. Patient educated on when it is appropriate to go to the emergency department.   Monia Pouch, FNP-C [facility] [phone]

## 2018-07-23 LAB — CBC WITH DIFFERENTIAL/PLATELET
Basophils Absolute: 0.1 10*3/uL (ref 0.0–0.2)
Basos: 1 %
EOS (ABSOLUTE): 0.4 10*3/uL (ref 0.0–0.4)
Eos: 6 %
Hematocrit: 36.5 % (ref 34.0–46.6)
Hemoglobin: 12.1 g/dL (ref 11.1–15.9)
Immature Grans (Abs): 0 10*3/uL (ref 0.0–0.1)
Immature Granulocytes: 0 %
LYMPHS: 31 %
Lymphocytes Absolute: 2 10*3/uL (ref 0.7–3.1)
MCH: 24.3 pg — ABNORMAL LOW (ref 26.6–33.0)
MCHC: 33.2 g/dL (ref 31.5–35.7)
MCV: 73 fL — ABNORMAL LOW (ref 79–97)
Monocytes Absolute: 0.3 10*3/uL (ref 0.1–0.9)
Monocytes: 5 %
Neutrophils Absolute: 3.7 10*3/uL (ref 1.4–7.0)
Neutrophils: 57 %
Platelets: 308 10*3/uL (ref 150–450)
RBC: 4.98 x10E6/uL (ref 3.77–5.28)
RDW: 15.2 % (ref 11.7–15.4)
WBC: 6.4 10*3/uL (ref 3.4–10.8)

## 2018-07-23 LAB — LIPID PANEL
CHOL/HDL RATIO: 5 ratio — AB (ref 0.0–4.4)
Cholesterol, Total: 198 mg/dL (ref 100–199)
HDL: 40 mg/dL (ref >39)
LDL Calculated: 122 mg/dL — ABNORMAL HIGH (ref 0–99)
Triglycerides: 178 mg/dL — ABNORMAL HIGH (ref 0–149)
VLDL Cholesterol Cal: 36 mg/dL (ref 5–40)

## 2018-07-23 LAB — CMP14+EGFR
ALT: 15 IU/L (ref 0–32)
AST: 14 IU/L (ref 0–40)
Albumin/Globulin Ratio: 1.3 (ref 1.2–2.2)
Albumin: 3.7 g/dL — ABNORMAL LOW (ref 3.8–4.8)
Alkaline Phosphatase: 74 IU/L (ref 39–117)
BUN/Creatinine Ratio: 9 (ref 9–23)
BUN: 8 mg/dL (ref 6–24)
Bilirubin Total: <0.2 mg/dL (ref 0.0–1.2)
CO2: 23 mmol/L (ref 20–29)
Calcium: 9.1 mg/dL (ref 8.7–10.2)
Chloride: 103 mmol/L (ref 96–106)
Creatinine, Ser: 0.87 mg/dL (ref 0.57–1.00)
GFR calc Af Amer: 92 mL/min/{1.73_m2} (ref >59)
GFR calc non Af Amer: 80 mL/min/{1.73_m2} (ref >59)
Globulin, Total: 2.9 g/dL (ref 1.5–4.5)
Glucose: 81 mg/dL (ref 65–99)
POTASSIUM: 4.2 mmol/L (ref 3.5–5.2)
Sodium: 140 mmol/L (ref 134–144)
Total Protein: 6.6 g/dL (ref 6.0–8.5)

## 2018-07-23 LAB — MICROALBUMIN / CREATININE URINE RATIO
Creatinine, Urine: 21.6 mg/dL
Microalb/Creat Ratio: <14 mg/g creat (ref 0–29)
Microalbumin, Urine: <3 ug/mL

## 2018-07-23 LAB — TSH: TSH: 2.09 u[IU]/mL (ref 0.450–4.500)

## 2018-08-02 DIAGNOSIS — M51369 Other intervertebral disc degeneration, lumbar region without mention of lumbar back pain or lower extremity pain: Secondary | ICD-10-CM | POA: Insufficient documentation

## 2018-08-02 DIAGNOSIS — M5136 Other intervertebral disc degeneration, lumbar region: Secondary | ICD-10-CM | POA: Insufficient documentation

## 2018-08-02 DIAGNOSIS — M47816 Spondylosis without myelopathy or radiculopathy, lumbar region: Secondary | ICD-10-CM | POA: Insufficient documentation

## 2018-08-22 ENCOUNTER — Ambulatory Visit: Payer: BC Managed Care – PPO | Admitting: Family Medicine

## 2018-10-24 ENCOUNTER — Other Ambulatory Visit: Payer: Self-pay | Admitting: Family Medicine

## 2018-10-24 DIAGNOSIS — F339 Major depressive disorder, recurrent, unspecified: Secondary | ICD-10-CM

## 2018-11-03 DIAGNOSIS — M5416 Radiculopathy, lumbar region: Secondary | ICD-10-CM | POA: Insufficient documentation

## 2018-11-29 ENCOUNTER — Other Ambulatory Visit: Payer: Self-pay | Admitting: Family Medicine

## 2018-11-29 DIAGNOSIS — F339 Major depressive disorder, recurrent, unspecified: Secondary | ICD-10-CM

## 2018-11-29 MED ORDER — SERTRALINE HCL 50 MG PO TABS: 50.0000 mg | ORAL_TABLET | Freq: Every day | ORAL | 0 refills | Status: DC

## 2018-11-29 NOTE — Telephone Encounter (Signed)
#  30 sent in - pt has appt scheduled

## 2018-12-02 ENCOUNTER — Other Ambulatory Visit: Payer: Self-pay

## 2018-12-02 ENCOUNTER — Encounter: Payer: Self-pay | Admitting: Family Medicine

## 2018-12-02 ENCOUNTER — Ambulatory Visit: Payer: BC Managed Care – PPO | Admitting: Family Medicine

## 2018-12-02 VITALS — BP 132/82 | HR 67 | Temp 97.5°F | Ht 61.0 in | Wt 184.0 lb

## 2018-12-02 DIAGNOSIS — F339 Major depressive disorder, recurrent, unspecified: Secondary | ICD-10-CM | POA: Diagnosis not present

## 2018-12-02 DIAGNOSIS — I10 Essential (primary) hypertension: Secondary | ICD-10-CM

## 2018-12-02 DIAGNOSIS — Z6834 Body mass index (BMI) 34.0-34.9, adult: Secondary | ICD-10-CM

## 2018-12-02 DIAGNOSIS — Z6835 Body mass index (BMI) 35.0-35.9, adult: Secondary | ICD-10-CM | POA: Insufficient documentation

## 2018-12-02 DIAGNOSIS — Z6832 Body mass index (BMI) 32.0-32.9, adult: Secondary | ICD-10-CM | POA: Insufficient documentation

## 2018-12-02 MED ORDER — SERTRALINE HCL 50 MG PO TABS: 50.0000 mg | ORAL_TABLET | Freq: Every day | ORAL | 6 refills | Status: DC

## 2018-12-02 MED ORDER — LISINOPRIL-HYDROCHLOROTHIAZIDE 20-25 MG PO TABS: ORAL_TABLET | ORAL | 1 refills | Status: DC

## 2018-12-02 NOTE — Progress Notes (Signed)
Subjective:  Patient ID: Jenny Brown, female    DOB: [birth year], 48 y.o.   MRN: [id number]  Chief Complaint:  Medical Management of Chronic Issues and Weight Gain   HPI: Jenny Brown is a 48 y.o. female presenting on 12/02/2018 for Medical Management of Chronic Issues and Weight Gain   1. Essential hypertension, benign  Complaint with meds - Yes Checking BP at home - No Exercising Regularly - Yes Watching Salt intake - Yes Pertinent ROS:  Headache - No Chest pain - No Dyspnea - No Palpitations - No LE edema - No They report good compliance with medications and can restate their regimen by memory. No medication side effects.  BP Readings from Last 3 Encounters:  12/02/18 132/82  07/22/18 (!) 158/95  04/06/18 131/74     2. Depression, recurrent ([place])  Doing well on current medications. Denies SI or HI.  Depression screen [facility] 2/9 12/02/2018 07/22/2018 03/07/2018 03/03/2017 12/07/2016  Decreased Interest 0 '2 2 3 '$ 0  Down, Depressed, Hopeless 0 '2 3 3 '$ 0  PHQ - 2 Score 0 '4 5 6 '$ 0  Altered sleeping 0 '3 3 3 '$ -  Tired, decreased energy 0 '3 3 3 '$ -  Change in appetite 0 '3 3 3 '$ -  Feeling bad or failure about yourself  0 '3 2 3 '$ -  Trouble concentrating 0 '3 2 3 '$ -  Moving slowly or fidgety/restless 0 '2 2 2 '$ -  Suicidal thoughts 0 0 1 1 -  PHQ-9 Score 0 '21 21 24 '$ -  Difficult doing work/chores - - Very difficult - -     3. BMI 34.0-34.9,adult  Pt states she has been dieting and exercising on a regular basis. States she has cut out soft drinks and has increased her water intake. States she is baking and grilling her foods vs frying them. States she is still unable to loose weight. States she is going through menopause and is concerned this may be affecting her weight loss. States her GYN has checked labs.      Relevant past medical, surgical, family, and social history reviewed and updated as indicated.  Allergies and medications reviewed and updated.   Past Medical History:  Diagnosis Date   . Essential hypertension, benign   . Hypertension   . OSA (obstructive sleep apnea)   . Restless legs syndrome   . Seizures ([place])     Past Surgical History:  Procedure Laterality Date  . DILITATION & CURRETTAGE/HYSTROSCOPY WITH NOVASURE ABLATION N/A 07/01/2017   Procedure: DILATATION & CURETTAGE/HYSTEROSCOPY WITH NOVASURE ABLATION;  Surgeon: [name], MD;  Location: [facility];  Service: Gynecology;  Laterality: N/A;  dr. requests 10:00 start  . TUBAL LIGATION      Social History   Socioeconomic History  . Marital status: Married    Spouse name: Not on file  . Number of children: Not on file  . Years of education: Not on file  . Highest education level: Not on file  Occupational History  . Not on file  Social Needs  . Financial resource strain: Not on file  . Food insecurity:    Worry: Not on file    Inability: Not on file  . Transportation needs:    Medical: Not on file    Non-medical: Not on file  Tobacco Use  . Smoking status: Never Smoker  . Smokeless tobacco: Never Used  Substance and Sexual Activity  . Alcohol use: No  . Drug use: No  . Sexual  activity: Yes    Birth control/protection: Surgical    Comment: tubal and ablation  Lifestyle  . Physical activity:    Days per week: Not on file    Minutes per session: Not on file  . Stress: Not on file  Relationships  . Social connections:    Talks on phone: Not on file    Gets together: Not on file    Attends religious service: Not on file    Active member of club or organization: Not on file    Attends meetings of clubs or organizations: Not on file    Relationship status: Not on file  . Intimate partner violence:    Fear of current or ex partner: Not on file    Emotionally abused: Not on file    Physically abused: Not on file    Forced sexual activity: Not on file  Other Topics Concern  . Not on file  Social History Narrative   ** Merged History Encounter **       Lives with husband and 2 children  in a one story home.  Works as a Optometrist.     Outpatient Encounter Medications as of 12/02/2018  Medication Sig  . celecoxib (CELEBREX) 200 MG capsule Take by mouth.  Marland Kitchen lisinopril-hydrochlorothiazide (ZESTORETIC) 20-25 MG tablet TAKE 1 TABLET BY MOUTH ONCE DAILY (NEEDS  TO  BE  SEEN  FOR  REFILLS)  . sertraline (ZOLOFT) 50 MG tablet Take 1 tablet (50 mg total) by mouth at bedtime. (Needs to be seen before next refill)  . [DISCONTINUED] lisinopril-hydrochlorothiazide (PRINZIDE,ZESTORETIC) 20-25 MG tablet TAKE 1 TABLET BY MOUTH ONCE DAILY (NEEDS  TO  BE  SEEN  FOR  REFILLS)  . [DISCONTINUED] sertraline (ZOLOFT) 50 MG tablet Take 1 tablet (50 mg total) by mouth at bedtime. (Needs to be seen before next refill)  . methocarbamol (ROBAXIN) 750 MG tablet TAKE 1 TABLET BY MOUTH 4 TIMES DAILY   Facility-Administered Encounter Medications as of 12/02/2018  Medication  . miconazole (MONISTAT 7) 2 % vaginal cream 1 Applicatorful    No Known Allergies  Review of Systems  Constitutional: Negative for activity change, appetite change, chills, diaphoresis, fatigue, fever and unexpected weight change.  Eyes: Negative for photophobia and visual disturbance.  Respiratory: Negative for cough and shortness of breath.   Cardiovascular: Negative for chest pain, palpitations and leg swelling.  Gastrointestinal: Negative for abdominal pain and blood in stool.  Endocrine: Negative for cold intolerance, heat intolerance, polydipsia, polyphagia and polyuria.  Genitourinary: Negative for decreased urine volume and difficulty urinating.  Musculoskeletal: Negative for arthralgias and myalgias.  Neurological: Negative for dizziness, tremors, seizures, syncope, facial asymmetry, speech difficulty, weakness, light-headedness, numbness and headaches.  Hematological: Does not bruise/bleed easily.  Psychiatric/Behavioral: Negative for agitation, behavioral problems, confusion, decreased concentration, dysphoric  mood, hallucinations, self-injury, sleep disturbance and suicidal ideas. The patient is not nervous/anxious and is not hyperactive.   All other systems reviewed and are negative.       Objective:  BP 132/82   Pulse 67   Temp (!) 97.5 F (36.4 C) (Oral)   Ht '5\' 1"'$  (1.549 m)   Wt 184 lb (83.5 kg)   BMI 34.77 kg/m    Wt Readings from Last 3 Encounters:  12/02/18 184 lb (83.5 kg)  07/22/18 185 lb (83.9 kg)  04/06/18 183 lb (83 kg)    Physical Exam Vitals signs and nursing note reviewed.  Constitutional:      General: She is not in  acute distress.    Appearance: Normal appearance. She is well-developed and well-groomed. She is not ill-appearing, toxic-appearing or diaphoretic.  HENT:     Head: Normocephalic and atraumatic.     Jaw: There is normal jaw occlusion.     Right Ear: Hearing normal.     Left Ear: Hearing normal.     Nose: Nose normal.     Mouth/Throat:     Lips: Pink.     Mouth: Mucous membranes are moist.     Pharynx: Oropharynx is clear. Uvula midline.  Eyes:     General: Lids are normal.     Extraocular Movements: Extraocular movements intact.     Conjunctiva/sclera: Conjunctivae normal.     Pupils: Pupils are equal, round, and reactive to light.  Neck:     Musculoskeletal: Normal range of motion and neck supple.     Thyroid: No thyroid mass, thyromegaly or thyroid tenderness.     Vascular: No carotid bruit or JVD.     Trachea: Trachea and phonation normal.  Cardiovascular:     Rate and Rhythm: Normal rate and regular rhythm.     Chest Wall: PMI is not displaced.     Pulses: Normal pulses.     Heart sounds: Normal heart sounds. No murmur. No friction rub. No gallop.   Pulmonary:     Effort: Pulmonary effort is normal. No respiratory distress.     Breath sounds: Normal breath sounds. No wheezing.  Abdominal:     General: Abdomen is protuberant. Bowel sounds are normal. There is no distension or abdominal bruit.     Palpations: Abdomen is soft. There is  no hepatomegaly or splenomegaly.     Tenderness: There is no abdominal tenderness. There is no right CVA tenderness or left CVA tenderness.     Hernia: No hernia is present.  Musculoskeletal: Normal range of motion.     Right lower leg: No edema.     Left lower leg: No edema.  Lymphadenopathy:     Cervical: No cervical adenopathy.  Skin:    General: Skin is warm and dry.     Capillary Refill: Capillary refill takes less than 2 seconds.     Coloration: Skin is not cyanotic, jaundiced or pale.     Findings: No rash.  Neurological:     General: No focal deficit present.     Mental Status: She is alert and oriented to person, place, and time.     Cranial Nerves: Cranial nerves are intact.     Sensory: Sensation is intact.     Motor: Motor function is intact.     Coordination: Coordination is intact.     Gait: Gait is intact.     Deep Tendon Reflexes: Reflexes are normal and symmetric.  Psychiatric:        Attention and Perception: Attention and perception normal.        Mood and Affect: Mood and affect normal.        Speech: Speech normal.        Behavior: Behavior normal. Behavior is cooperative.        Thought Content: Thought content normal.        Cognition and Memory: Cognition and memory normal.        Judgment: Judgment normal.     Results for orders placed or performed in visit on 07/22/18  CMP14+EGFR  Result Value Ref Range   Glucose 81 65 - 99 mg/dL   BUN 8 6 - 24 mg/dL  Creatinine, Ser 0.87 0.57 - 1.00 mg/dL   GFR calc non Af Amer 80 >59 mL/min/1.73   GFR calc Af Amer 92 >59 mL/min/1.73   BUN/Creatinine Ratio 9 9 - 23   Sodium 140 134 - 144 mmol/L   Potassium 4.2 3.5 - 5.2 mmol/L   Chloride 103 96 - 106 mmol/L   CO2 23 20 - 29 mmol/L   Calcium 9.1 8.7 - 10.2 mg/dL   Total Protein 6.6 6.0 - 8.5 g/dL   Albumin 3.7 (L) 3.8 - 4.8 g/dL   Globulin, Total 2.9 1.5 - 4.5 g/dL   Albumin/Globulin Ratio 1.3 1.2 - 2.2   Bilirubin Total <0.2 0.0 - 1.2 mg/dL   Alkaline  Phosphatase 74 39 - 117 IU/L   AST 14 0 - 40 IU/L   ALT 15 0 - 32 IU/L  Lipid panel  Result Value Ref Range   Cholesterol, Total 198 100 - 199 mg/dL   Triglycerides 178 (H) 0 - 149 mg/dL   HDL 40 >39 mg/dL   VLDL Cholesterol Cal 36 5 - 40 mg/dL   LDL Calculated 122 (H) 0 - 99 mg/dL   Chol/HDL Ratio 5.0 (H) 0.0 - 4.4 ratio  TSH  Result Value Ref Range   TSH 2.090 0.450 - 4.500 uIU/mL  CBC with Differential/Platelet  Result Value Ref Range   WBC 6.4 3.4 - 10.8 x10E3/uL   RBC 4.98 3.77 - 5.28 x10E6/uL   Hemoglobin 12.1 11.1 - 15.9 g/dL   Hematocrit 36.5 34.0 - 46.6 %   MCV 73 (L) 79 - 97 fL   MCH 24.3 (L) 26.6 - 33.0 pg   MCHC 33.2 31.5 - 35.7 g/dL   RDW 15.2 11.7 - 15.4 %   Platelets 308 150 - 450 x10E3/uL   Neutrophils 57 Not Estab. %   Lymphs 31 Not Estab. %   Monocytes 5 Not Estab. %   Eos 6 Not Estab. %   Basos 1 Not Estab. %   Neutrophils Absolute 3.7 1.4 - 7.0 x10E3/uL   Lymphocytes Absolute 2.0 0.7 - 3.1 x10E3/uL   Monocytes Absolute 0.3 0.1 - 0.9 x10E3/uL   EOS (ABSOLUTE) 0.4 0.0 - 0.4 x10E3/uL   Basophils Absolute 0.1 0.0 - 0.2 x10E3/uL   Immature Granulocytes 0 Not Estab. %   Immature Grans (Abs) 0.0 0.0 - 0.1 x10E3/uL  Microalbumin / creatinine urine ratio  Result Value Ref Range   Creatinine, Urine 21.6 Not Estab. mg/dL   Microalbumin, Urine <3.0 Not Estab. ug/mL   Microalb/Creat Ratio <14 0 - 29 mg/g creat       Pertinent labs & imaging results that were available during my care of the patient were reviewed by me and considered in my medical decision making.  Assessment & Plan:  Kierah was seen today for medical management of chronic issues and weight gain.  Diagnoses and all orders for this visit:  Essential hypertension, benign Doing well on current medications. Diet and exercise encouraged. Continue below.  -     lisinopril-hydrochlorothiazide (ZESTORETIC) 20-25 MG tablet; TAKE 1 TABLET BY MOUTH ONCE DAILY (NEEDS  TO  BE  SEEN  FOR  REFILLS)   Depression, recurrent (HCC) Well controlled with sertraline. Report any new or worsening symptoms. Continue below.  -     sertraline (ZOLOFT) 50 MG tablet; Take 1 tablet (50 mg total) by mouth at bedtime. (Needs to be seen before next refill)  BMI 34.0-34.9,adult Diet and exercise encouraged. Has been trying to manage weight at home  without success. Will refer to weight management.  -     Amb Ref to Medical Weight Management     Continue all other maintenance medications.  Follow up plan: Return in about 3 months (around 03/04/2019), or if symptoms worsen or fail to improve, for BMI, HTN, labs.  Educational handout given for weight loss   The above assessment and management plan was discussed with the patient. The patient verbalized understanding of and has agreed to the management plan. Patient is aware to call the clinic if symptoms persist or worsen. Patient is aware when to return to the clinic for a follow-up visit. Patient educated on when it is appropriate to go to the emergency department.   Monia Pouch, FNP-C [facility] [phone]

## 2018-12-02 NOTE — Patient Instructions (Signed)

## 2018-12-12 ENCOUNTER — Encounter: Payer: Self-pay | Admitting: Family Medicine

## 2018-12-20 ENCOUNTER — Ambulatory Visit: Payer: BC Managed Care – PPO | Admitting: Family Medicine

## 2019-01-02 ENCOUNTER — Other Ambulatory Visit: Payer: Self-pay

## 2019-01-03 ENCOUNTER — Ambulatory Visit: Payer: BC Managed Care – PPO | Admitting: Family Medicine

## 2019-01-03 ENCOUNTER — Encounter: Payer: Self-pay | Admitting: Family Medicine

## 2019-01-03 VITALS — BP 104/67 | HR 75 | Temp 99.1°F | Ht 61.0 in | Wt 188.2 lb

## 2019-01-03 DIAGNOSIS — I10 Essential (primary) hypertension: Secondary | ICD-10-CM

## 2019-01-03 DIAGNOSIS — Z6835 Body mass index (BMI) 35.0-35.9, adult: Secondary | ICD-10-CM

## 2019-01-03 DIAGNOSIS — F339 Major depressive disorder, recurrent, unspecified: Secondary | ICD-10-CM | POA: Diagnosis not present

## 2019-01-03 MED ORDER — PHENTERMINE HCL 37.5 MG PO TABS: 18.7500 mg | ORAL_TABLET | Freq: Every day | ORAL | 3 refills | Status: DC

## 2019-01-03 NOTE — Progress Notes (Signed)
Subjective:  Patient ID: Jenny Brown, female    DOB: 09-29-1970, 48 y.o.   MRN: [id number]  Chief Complaint:  Weight Check (1 month )   HPI: Jenny Brown is a 48 y.o. female presenting on 01/03/2019 for Weight Check (1 month )   1. Essential hypertension, benign  Complaint with meds - Yes Checking BP at home- No Exercising Regularly - Yes Watching Salt intake - Yes Pertinent ROS:  Headache - No Chest pain - No Dyspnea - No Palpitations - No LE edema - No They report good compliance with medications and can restate their regimen by memory. No medication side effects.  BP Readings from Last 3 Encounters:  01/03/19 104/67  12/02/18 132/82  07/22/18 (!) 158/95     2. Depression, recurrent ([place])  Doing well. Does feel a little discouraged due to inability to drop weight with diet and exercise. No SI or HI.  Depression screen [facility] 2/9 01/03/2019 12/02/2018 07/22/2018 03/07/2018 03/03/2017  Decreased Interest 0 0 '2 2 3  '$ Down, Depressed, Hopeless 0 0 '2 3 3  '$ PHQ - 2 Score 0 0 '4 5 6  '$ Altered sleeping - 0 '3 3 3  '$ Tired, decreased energy - 0 '3 3 3  '$ Change in appetite - 0 '3 3 3  '$ Feeling bad or failure about yourself  - 0 '3 2 3  '$ Trouble concentrating - 0 '3 2 3  '$ Moving slowly or fidgety/restless - 0 '2 2 2  '$ Suicidal thoughts - 0 0 1 1  PHQ-9 Score - 0 '21 21 24  '$ Difficult doing work/chores - - - Very difficult -     3. BMI 35.0-35.9,adult  Insurance would not cover referral to weight management. Pt has drastically changed lifestyle by changing dietary habits and exercising at least 4-5 times per week. Pt states she remains unable to drop weight despite these lifestyle changes. She has been drinking more water and eating fresh fruits and vegetables and lean proteins. States she has been avoiding snacking and sweets. She has had a 4 pound weight gain since last visit.      Relevant past medical, surgical, family, and social history reviewed and updated as indicated.  Allergies and medications  reviewed and updated.   Past Medical History:  Diagnosis Date  . Essential hypertension, benign   . Hypertension   . OSA (obstructive sleep apnea)   . Restless legs syndrome   . Seizures ([place])     Past Surgical History:  Procedure Laterality Date  . DILITATION & CURRETTAGE/HYSTROSCOPY WITH NOVASURE ABLATION N/A 07/01/2017   Procedure: DILATATION & CURETTAGE/HYSTEROSCOPY WITH NOVASURE ABLATION;  Surgeon: [name], MD;  Location: [facility];  Service: Gynecology;  Laterality: N/A;  dr. requests 10:00 start  . TUBAL LIGATION      Social History   Socioeconomic History  . Marital status: Married    Spouse name: Not on file  . Number of children: Not on file  . Years of education: Not on file  . Highest education level: Not on file  Occupational History  . Not on file  Social Needs  . Financial resource strain: Not on file  . Food insecurity    Worry: Not on file    Inability: Not on file  . Transportation needs    Medical: Not on file    Non-medical: Not on file  Tobacco Use  . Smoking status: Never Smoker  . Smokeless tobacco: Never Used  Substance and Sexual Activity  . Alcohol  use: No  . Drug use: No  . Sexual activity: Yes    Birth control/protection: Surgical    Comment: tubal and ablation  Lifestyle  . Physical activity    Days per week: Not on file    Minutes per session: Not on file  . Stress: Not on file  Relationships  . Social Herbalist on phone: Not on file    Gets together: Not on file    Attends religious service: Not on file    Active member of club or organization: Not on file    Attends meetings of clubs or organizations: Not on file    Relationship status: Not on file  . Intimate partner violence    Fear of current or ex partner: Not on file    Emotionally abused: Not on file    Physically abused: Not on file    Forced sexual activity: Not on file  Other Topics Concern  . Not on file  Social History Narrative   ** Merged  History Encounter **       Lives with husband and 2 children in a one story home.  Works as a Optometrist.     Outpatient Encounter Medications as of 01/03/2019  Medication Sig  . celecoxib (CELEBREX) 200 MG capsule Take by mouth.  Marland Kitchen lisinopril-hydrochlorothiazide (ZESTORETIC) 20-25 MG tablet TAKE 1 TABLET BY MOUTH ONCE DAILY (NEEDS  TO  BE  SEEN  FOR  REFILLS)  . methocarbamol (ROBAXIN) 750 MG tablet TAKE 1 TABLET BY MOUTH 4 TIMES DAILY  . sertraline (ZOLOFT) 50 MG tablet Take 1 tablet (50 mg total) by mouth at bedtime. (Needs to be seen before next refill)  . phentermine (ADIPEX-P) 37.5 MG tablet Take 0.5 tablets (18.75 mg total) by mouth daily before breakfast.  . [DISCONTINUED] miconazole (MONISTAT 7) 2 % vaginal cream 1 Applicatorful    No facility-administered encounter medications on file as of 01/03/2019.     No Known Allergies  Review of Systems  Constitutional: Positive for unexpected weight change. Negative for activity change, appetite change, chills, fatigue and fever.  HENT: Negative.   Eyes: Negative.   Respiratory: Negative for cough, chest tightness and shortness of breath.   Cardiovascular: Negative for chest pain, palpitations and leg swelling.  Gastrointestinal: Negative for blood in stool, constipation, diarrhea, nausea and vomiting.  Endocrine: Negative.   Genitourinary: Negative for dysuria, frequency and urgency.  Musculoskeletal: Negative for arthralgias and myalgias.  Skin: Negative.   Allergic/Immunologic: Negative.   Neurological: Negative for dizziness and headaches.  Hematological: Negative.   Psychiatric/Behavioral: Negative for confusion, hallucinations, sleep disturbance and suicidal ideas.  All other systems reviewed and are negative.       Objective:  BP 104/67   Pulse 75   Temp 99.1 F (37.3 C) (Oral)   Ht '5\' 1"'$  (1.549 m)   Wt 188 lb 3.2 oz (85.4 kg)   BMI 35.56 kg/m    Wt Readings from Last 3 Encounters:  01/03/19 188 lb 3.2  oz (85.4 kg)  12/02/18 184 lb (83.5 kg)  07/22/18 185 lb (83.9 kg)    Physical Exam Vitals signs and nursing note reviewed.  Constitutional:      General: She is not in acute distress.    Appearance: Normal appearance. She is well-developed and well-groomed. She is not ill-appearing, toxic-appearing or diaphoretic.  HENT:     Head: Normocephalic and atraumatic.     Jaw: There is normal jaw occlusion.  Right Ear: Hearing normal.     Left Ear: Hearing normal.     Nose: Nose normal.     Mouth/Throat:     Lips: Pink.     Mouth: Mucous membranes are moist.     Pharynx: Oropharynx is clear. Uvula midline.  Eyes:     General: Lids are normal.     Extraocular Movements: Extraocular movements intact.     Conjunctiva/sclera: Conjunctivae normal.     Pupils: Pupils are equal, round, and reactive to light.  Neck:     Musculoskeletal: Normal range of motion and neck supple.     Thyroid: No thyroid mass, thyromegaly or thyroid tenderness.     Vascular: No carotid bruit or JVD.     Trachea: Trachea and phonation normal.  Cardiovascular:     Rate and Rhythm: Normal rate and regular rhythm.     Chest Wall: PMI is not displaced.     Pulses: Normal pulses.     Heart sounds: Normal heart sounds. No murmur. No friction rub. No gallop.   Pulmonary:     Effort: Pulmonary effort is normal. No respiratory distress.     Breath sounds: Normal breath sounds. No wheezing.  Abdominal:     General: Bowel sounds are normal. There is no distension or abdominal bruit.     Palpations: Abdomen is soft. There is no hepatomegaly or splenomegaly.     Tenderness: There is no abdominal tenderness. There is no right CVA tenderness or left CVA tenderness.     Hernia: No hernia is present.  Musculoskeletal: Normal range of motion.     Right lower leg: No edema.     Left lower leg: No edema.  Lymphadenopathy:     Cervical: No cervical adenopathy.  Skin:    General: Skin is warm and dry.     Capillary Refill:  Capillary refill takes less than 2 seconds.     Coloration: Skin is not cyanotic, jaundiced or pale.     Findings: No rash.  Neurological:     General: No focal deficit present.     Mental Status: She is alert and oriented to person, place, and time.     Cranial Nerves: Cranial nerves are intact.     Sensory: Sensation is intact.     Motor: Motor function is intact.     Coordination: Coordination is intact.     Gait: Gait is intact.     Deep Tendon Reflexes: Reflexes are normal and symmetric.  Psychiatric:        Attention and Perception: Attention and perception normal.        Mood and Affect: Mood and affect normal.        Speech: Speech normal.        Behavior: Behavior normal. Behavior is cooperative.        Thought Content: Thought content normal.        Cognition and Memory: Cognition and memory normal.        Judgment: Judgment normal.     Results for orders placed or performed in visit on 07/22/18  CMP14+EGFR  Result Value Ref Range   Glucose 81 65 - 99 mg/dL   BUN 8 6 - 24 mg/dL   Creatinine, Ser 0.87 0.57 - 1.00 mg/dL   GFR calc non Af Amer 80 >59 mL/min/1.73   GFR calc Af Amer 92 >59 mL/min/1.73   BUN/Creatinine Ratio 9 9 - 23   Sodium 140 134 - 144 mmol/L   Potassium  4.2 3.5 - 5.2 mmol/L   Chloride 103 96 - 106 mmol/L   CO2 23 20 - 29 mmol/L   Calcium 9.1 8.7 - 10.2 mg/dL   Total Protein 6.6 6.0 - 8.5 g/dL   Albumin 3.7 (L) 3.8 - 4.8 g/dL   Globulin, Total 2.9 1.5 - 4.5 g/dL   Albumin/Globulin Ratio 1.3 1.2 - 2.2   Bilirubin Total <0.2 0.0 - 1.2 mg/dL   Alkaline Phosphatase 74 39 - 117 IU/L   AST 14 0 - 40 IU/L   ALT 15 0 - 32 IU/L  Lipid panel  Result Value Ref Range   Cholesterol, Total 198 100 - 199 mg/dL   Triglycerides 178 (H) 0 - 149 mg/dL   HDL 40 >39 mg/dL   VLDL Cholesterol Cal 36 5 - 40 mg/dL   LDL Calculated 122 (H) 0 - 99 mg/dL   Chol/HDL Ratio 5.0 (H) 0.0 - 4.4 ratio  TSH  Result Value Ref Range   TSH 2.090 0.450 - 4.500 uIU/mL  CBC  with Differential/Platelet  Result Value Ref Range   WBC 6.4 3.4 - 10.8 x10E3/uL   RBC 4.98 3.77 - 5.28 x10E6/uL   Hemoglobin 12.1 11.1 - 15.9 g/dL   Hematocrit 36.5 34.0 - 46.6 %   MCV 73 (L) 79 - 97 fL   MCH 24.3 (L) 26.6 - 33.0 pg   MCHC 33.2 31.5 - 35.7 g/dL   RDW 15.2 11.7 - 15.4 %   Platelets 308 150 - 450 x10E3/uL   Neutrophils 57 Not Estab. %   Lymphs 31 Not Estab. %   Monocytes 5 Not Estab. %   Eos 6 Not Estab. %   Basos 1 Not Estab. %   Neutrophils Absolute 3.7 1.4 - 7.0 x10E3/uL   Lymphocytes Absolute 2.0 0.7 - 3.1 x10E3/uL   Monocytes Absolute 0.3 0.1 - 0.9 x10E3/uL   EOS (ABSOLUTE) 0.4 0.0 - 0.4 x10E3/uL   Basophils Absolute 0.1 0.0 - 0.2 x10E3/uL   Immature Granulocytes 0 Not Estab. %   Immature Grans (Abs) 0.0 0.0 - 0.1 x10E3/uL  Microalbumin / creatinine urine ratio  Result Value Ref Range   Creatinine, Urine 21.6 Not Estab. mg/dL   Microalbumin, Urine <3.0 Not Estab. ug/mL   Microalb/Creat Ratio <14 0 - 29 mg/g creat       Pertinent labs & imaging results that were available during my care of the patient were reviewed by me and considered in my medical decision making.  Assessment & Plan:  Lou[place]ia was seen today for weight check.  Diagnoses and all orders for this visit:  Essential hypertension, benign Well controlled. Diet and exercise encouraged. Labs pending.  -     CBC with Differential/Platelet -     CMP14+EGFR -     Thyroid Panel With TSH  Depression, recurrent ([place]) Doing well on current medications. Will check TSH. Report any new or worsening symptoms.  -     Thyroid Panel With TSH  BMI 35.0-35.9,adult Insurance would not cover referral to weight management clinic. Pt has been dieting and exercising without success. Will trial below. Will start with 1/2 tablet daily. Monitor BP and report any persistent highs. Follow up in 3 months.  -     CMP14+EGFR -     Lipid panel -     Thyroid Panel With TSH -     phentermine (ADIPEX-P) 37.5 MG tablet;  Take 0.5 tablets (18.75 mg total) by mouth daily before breakfast.     Continue  all other maintenance medications.  Follow up plan: Return in about 3 months (around 04/05/2019), or if symptoms worsen or fail to improve, for weight check.  Educational handout given for survey  The above assessment and management plan was discussed with the patient. The patient verbalized understanding of and has agreed to the management plan. Patient is aware to call the clinic if symptoms persist or worsen. Patient is aware when to return to the clinic for a follow-up visit. Patient educated on when it is appropriate to go to the emergency department.   Monia Pouch, FNP-C [facility] [phone]

## 2019-01-03 NOTE — Patient Instructions (Signed)
It was a pleasure seeing you today, Shauna.  Information regarding what we discussed is included in this packet.  Please make an appointment to see me in 3 months.   In a few days you may receive a survey in the mail or online from [employer] regarding your visit with [place] today. Please take a moment to fill this out. Your feedback is very important to our office. It can help [place] better understand your needs as well as improve your experience and satisfaction. Thank you for taking your time to complete it. We care about you.  Because of recent events of COVID-19 ("Coronavirus"), please follow CDC recommendations:   1. Wash your hand frequently 2. Avoid touching your face 3. Stay away from people who are sick 4. If you have symptoms such as fever, cough, shortness of breath then call your healthcare provider for further guidance 5. If you are sick, STAY AT HOME, unless otherwise directed by your healthcare provider. 6. Follow directions from state and national officials regarding staying safe    Please feel free to call our office if any questions or concerns arise.  Warm Regards, Monia Pouch, FNP-C [facility] [address] [place], [place] [address] [phone]

## 2019-01-04 LAB — CMP14+EGFR
ALT: 22 IU/L (ref 0–32)
AST: 18 IU/L (ref 0–40)
Albumin/Globulin Ratio: 1.4 (ref 1.2–2.2)
Albumin: 4.2 g/dL (ref 3.8–4.8)
Alkaline Phosphatase: 77 IU/L (ref 39–117)
BUN/Creatinine Ratio: 18 (ref 9–23)
BUN: 16 mg/dL (ref 6–24)
Bilirubin Total: <0.2 mg/dL (ref 0.0–1.2)
CO2: 22 mmol/L (ref 20–29)
Calcium: 9.5 mg/dL (ref 8.7–10.2)
Chloride: 98 mmol/L (ref 96–106)
Creatinine, Ser: 0.87 mg/dL (ref 0.57–1.00)
GFR calc Af Amer: 92 mL/min/{1.73_m2} (ref >59)
GFR calc non Af Amer: 80 mL/min/{1.73_m2} (ref >59)
Globulin, Total: 3 g/dL (ref 1.5–4.5)
Glucose: 110 mg/dL — ABNORMAL HIGH (ref 65–99)
Potassium: 3.8 mmol/L (ref 3.5–5.2)
Sodium: 137 mmol/L (ref 134–144)
Total Protein: 7.2 g/dL (ref 6.0–8.5)

## 2019-01-04 LAB — THYROID PANEL WITH TSH
Free Thyroxine Index: 1.5 (ref 1.2–4.9)
T3 Uptake Ratio: 22 % — ABNORMAL LOW (ref 24–39)
T4, Total: 6.7 ug/dL (ref 4.5–12.0)
TSH: 2.75 u[IU]/mL (ref 0.450–4.500)

## 2019-01-04 LAB — CBC WITH DIFFERENTIAL/PLATELET
Basophils Absolute: 0.1 10*3/uL (ref 0.0–0.2)
Basos: 1 %
EOS (ABSOLUTE): 0.4 10*3/uL (ref 0.0–0.4)
Eos: 4 %
Hematocrit: 40.8 % (ref 34.0–46.6)
Hemoglobin: 13.2 g/dL (ref 11.1–15.9)
Immature Grans (Abs): 0.1 10*3/uL (ref 0.0–0.1)
Immature Granulocytes: 1 %
Lymphocytes Absolute: 2.2 10*3/uL (ref 0.7–3.1)
Lymphs: 22 %
MCH: 24 pg — ABNORMAL LOW (ref 26.6–33.0)
MCHC: 32.4 g/dL (ref 31.5–35.7)
MCV: 74 fL — ABNORMAL LOW (ref 79–97)
Monocytes Absolute: 0.6 10*3/uL (ref 0.1–0.9)
Monocytes: 6 %
Neutrophils Absolute: 6.6 10*3/uL (ref 1.4–7.0)
Neutrophils: 66 %
Platelets: 337 10*3/uL (ref 150–450)
RBC: 5.5 x10E6/uL — ABNORMAL HIGH (ref 3.77–5.28)
RDW: 14.9 % (ref 11.7–15.4)
WBC: 10 10*3/uL (ref 3.4–10.8)

## 2019-01-04 LAB — LIPID PANEL
Chol/HDL Ratio: 6.1 ratio — ABNORMAL HIGH (ref 0.0–4.4)
Cholesterol, Total: 237 mg/dL — ABNORMAL HIGH (ref 100–199)
HDL: 39 mg/dL — ABNORMAL LOW (ref >39)
LDL Calculated: 133 mg/dL — ABNORMAL HIGH (ref 0–99)
Triglycerides: 326 mg/dL — ABNORMAL HIGH (ref 0–149)
VLDL Cholesterol Cal: 65 mg/dL — ABNORMAL HIGH (ref 5–40)

## 2019-01-05 ENCOUNTER — Encounter: Payer: Self-pay | Admitting: *Deleted

## 2019-03-02 ENCOUNTER — Ambulatory Visit: Payer: BC Managed Care – PPO | Admitting: Family Medicine

## 2019-04-05 ENCOUNTER — Ambulatory Visit (INDEPENDENT_AMBULATORY_CARE_PROVIDER_SITE_OTHER): Payer: BC Managed Care – PPO | Admitting: Family Medicine

## 2019-04-05 ENCOUNTER — Encounter: Payer: Self-pay | Admitting: Family Medicine

## 2019-04-05 VITALS — Ht 60.0 in | Wt 178.0 lb

## 2019-04-05 DIAGNOSIS — I10 Essential (primary) hypertension: Secondary | ICD-10-CM | POA: Diagnosis not present

## 2019-04-05 DIAGNOSIS — F339 Major depressive disorder, recurrent, unspecified: Secondary | ICD-10-CM | POA: Diagnosis not present

## 2019-04-05 DIAGNOSIS — Z7689 Persons encountering health services in other specified circumstances: Secondary | ICD-10-CM

## 2019-04-05 MED ORDER — SERTRALINE HCL 50 MG PO TABS: 50.0000 mg | ORAL_TABLET | Freq: Every day | ORAL | 6 refills | Status: DC

## 2019-04-05 NOTE — Progress Notes (Signed)
Virtual Visit via telephone Note Due to COVID-19 pandemic this visit was conducted virtually. This visit type was conducted due to national recommendations for restrictions regarding the COVID-19 Pandemic (e.g. social distancing, sheltering in place) in an effort to limit this patient's exposure and mitigate transmission in our community. All issues noted in this document were discussed and addressed.  A physical exam was not performed with this format.   I connected with Jenny Brown on 04/05/19 at 1530 by telephone and verified that I am speaking with the correct person using two identifiers. Jenny Brown is currently located at home and family is currently with them during visit. The provider, Monia Pouch, FNP is located in their office at time of visit.  I discussed the limitations, risks, security and privacy concerns of performing an evaluation and management service by telephone and the availability of in person appointments. I also discussed with the patient that there may be a patient responsible charge related to this service. The patient expressed understanding and agreed to proceed.  Subjective:  Patient ID: Jenny Brown, female    DOB: Feb 04, 1971, 48 y.o.   MRN: [id number]  Chief Complaint:  Weight Check and Depression   HPI: Jenny Brown is a 48 y.o. female presenting on 04/05/2019 for Weight Check and Depression   Pt following up today after initiation of Adipex for weight management. Pt states she is doing well. She has changed her diet and is active daily. States she has lost about 10 pounds since starting the diet and medications. States she is tolerating the medications without associated side effects.  She reports her blood pressure has been well controlled at home, 120/70. No chest pain, shortness of breath, leg swelling, palpitations, dizziness, or syncope. Compliant with medications.   Depression        This is a recurrent problem.  The current episode started more than 1  year ago.   The problem occurs intermittently.  The problem has been waxing and waning since onset.  Associated symptoms include no decreased concentration, no fatigue, no helplessness, no hopelessness, does not have insomnia, not irritable, no restlessness, no decreased interest, no appetite change, no body aches, no myalgias, no headaches, no indigestion, not sad and no suicidal ideas.     The symptoms are aggravated by family issues and work stress.  Past treatments include SSRIs - Selective serotonin reuptake inhibitors.  Compliance with treatment is good.  Previous treatment provided significant relief.    Relevant past medical, surgical, family, and social history reviewed and updated as indicated.  Allergies and medications reviewed and updated.   Past Medical History:  Diagnosis Date  . Essential hypertension, benign   . Hypertension   . OSA (obstructive sleep apnea)   . Restless legs syndrome   . Seizures ([place])     Past Surgical History:  Procedure Laterality Date  . DILITATION & CURRETTAGE/HYSTROSCOPY WITH NOVASURE ABLATION N/A 07/01/2017   Procedure: DILATATION & CURETTAGE/HYSTEROSCOPY WITH NOVASURE ABLATION;  Surgeon: [name], MD;  Location: [facility];  Service: Gynecology;  Laterality: N/A;  dr. requests 10:00 start  . TUBAL LIGATION      Social History   Socioeconomic History  . Marital status: Married    Spouse name: Not on file  . Number of children: Not on file  . Years of education: Not on file  . Highest education level: Not on file  Occupational History  . Not on file  Social Needs  . Financial resource strain: Not on  file  . Food insecurity    Worry: Not on file    Inability: Not on file  . Transportation needs    Medical: Not on file    Non-medical: Not on file  Tobacco Use  . Smoking status: Never Smoker  . Smokeless tobacco: Never Used  Substance and Sexual Activity  . Alcohol use: No  . Drug use: No  . Sexual activity: Yes    Birth  control/protection: Surgical    Comment: tubal and ablation  Lifestyle  . Physical activity    Days per week: Not on file    Minutes per session: Not on file  . Stress: Not on file  Relationships  . Social Herbalist on phone: Not on file    Gets together: Not on file    Attends religious service: Not on file    Active member of club or organization: Not on file    Attends meetings of clubs or organizations: Not on file    Relationship status: Not on file  . Intimate partner violence    Fear of current or ex partner: Not on file    Emotionally abused: Not on file    Physically abused: Not on file    Forced sexual activity: Not on file  Other Topics Concern  . Not on file  Social History Narrative   ** Merged History Encounter **       Lives with husband and 2 children in a one story home.  Works as a Optometrist.     Outpatient Encounter Medications as of 04/05/2019  Medication Sig  . celecoxib (CELEBREX) 200 MG capsule Take by mouth.  Marland Kitchen lisinopril-hydrochlorothiazide (ZESTORETIC) 20-25 MG tablet TAKE 1 TABLET BY MOUTH ONCE DAILY (NEEDS  TO  BE  SEEN  FOR  REFILLS)  . methocarbamol (ROBAXIN) 750 MG tablet TAKE 1 TABLET BY MOUTH 4 TIMES DAILY  . phentermine (ADIPEX-P) 37.5 MG tablet Take 0.5 tablets (18.75 mg total) by mouth daily before breakfast.  . sertraline (ZOLOFT) 50 MG tablet Take 1 tablet (50 mg total) by mouth at bedtime. (Needs to be seen before next refill)  . [DISCONTINUED] sertraline (ZOLOFT) 50 MG tablet Take 1 tablet (50 mg total) by mouth at bedtime. (Needs to be seen before next refill)   No facility-administered encounter medications on file as of 04/05/2019.     No Known Allergies  Review of Systems  Constitutional: Negative for activity change, appetite change, chills, diaphoresis, fatigue, fever and unexpected weight change.  HENT: Negative.   Eyes: Negative.  Negative for photophobia and visual disturbance.  Respiratory: Negative for  cough, chest tightness and shortness of breath.   Cardiovascular: Negative for chest pain, palpitations and leg swelling.  Gastrointestinal: Negative for abdominal distention, abdominal pain, anal bleeding, blood in stool, constipation, diarrhea, nausea, rectal pain and vomiting.  Endocrine: Negative.  Negative for polydipsia, polyphagia and polyuria.  Genitourinary: Negative for decreased urine volume, difficulty urinating, dysuria, frequency and urgency.  Musculoskeletal: Negative for arthralgias and myalgias.  Skin: Negative.   Allergic/Immunologic: Negative.   Neurological: Negative for dizziness, tremors, seizures, syncope, facial asymmetry, speech difficulty, weakness, light-headedness, numbness and headaches.  Hematological: Negative.   Psychiatric/Behavioral: Positive for depression. Negative for agitation, behavioral problems, confusion, decreased concentration, dysphoric mood, hallucinations, self-injury, sleep disturbance and suicidal ideas. The patient is not nervous/anxious, does not have insomnia and is not hyperactive.   All other systems reviewed and are negative.  Observations/Objective: No vital signs or physical exam, this was a telephone or virtual health encounter.  Pt alert and oriented, answers all questions appropriately, and able to speak in full sentences.   Last Weight  Most recent update: 04/05/2019  3:41 PM   Weight  80.7 kg (178 lb)           Body mass index is 34.76 kg/m.   Assessment and Plan: Jenny Brown was seen today for weight check and depression.  Diagnoses and all orders for this visit:  Depression, recurrent ([place]) Stable, continue below.  -     sertraline (ZOLOFT) 50 MG tablet; Take 1 tablet (50 mg total) by mouth at bedtime. (Needs to be seen before next refill)  Encounter for weight management Diet and exercise encouraged. Weight down 10 lbs since 12/2018. Continue Adipex. Follow up in office in 4-6 weeks.   Essential hypertension,  benign Reported BP of 120/70 at home. Diet and exercise encouraged. DASH diet. Continue medications as prescribed.     Follow Up Instructions: Return if symptoms worsen or fail to improve.    I discussed the assessment and treatment plan with the patient. The patient was provided an opportunity to ask questions and all were answered. The patient agreed with the plan and demonstrated an understanding of the instructions.   The patient was advised to call back or seek an in-person evaluation if the symptoms worsen or if the condition fails to improve as anticipated.  The above assessment and management plan was discussed with the patient. The patient verbalized understanding of and has agreed to the management plan. Patient is aware to call the clinic if they develop any new symptoms or if symptoms persist or worsen. Patient is aware when to return to the clinic for a follow-up visit. Patient educated on when it is appropriate to go to the emergency department.    I provided 15 minutes of non-face-to-face time during this encounter. The call started at 1530. The call ended at 1545. The other time was used for coordination of care.    Monia Pouch, FNP-C [facility] [address] [place], [place] [address] [phone] 04/05/19

## 2019-06-05 ENCOUNTER — Ambulatory Visit: Payer: BC Managed Care – PPO | Admitting: Family Medicine

## 2019-07-12 ENCOUNTER — Encounter: Payer: Self-pay | Admitting: Family Medicine

## 2019-07-12 ENCOUNTER — Ambulatory Visit (INDEPENDENT_AMBULATORY_CARE_PROVIDER_SITE_OTHER): Payer: BC Managed Care – PPO | Admitting: Family Medicine

## 2019-07-12 VITALS — BP 117/79 | HR 96 | Temp 98.9°F | Resp 20 | Ht 60.0 in | Wt 168.0 lb

## 2019-07-12 DIAGNOSIS — Z79899 Other long term (current) drug therapy: Secondary | ICD-10-CM | POA: Insufficient documentation

## 2019-07-12 DIAGNOSIS — M2550 Pain in unspecified joint: Secondary | ICD-10-CM | POA: Diagnosis not present

## 2019-07-12 DIAGNOSIS — R202 Paresthesia of skin: Secondary | ICD-10-CM

## 2019-07-12 DIAGNOSIS — E559 Vitamin D deficiency, unspecified: Secondary | ICD-10-CM

## 2019-07-12 DIAGNOSIS — G8929 Other chronic pain: Secondary | ICD-10-CM | POA: Insufficient documentation

## 2019-07-12 MED ORDER — DULOXETINE HCL 30 MG PO CPEP: 30.0000 mg | ORAL_CAPSULE | Freq: Every day | ORAL | 4 refills | Status: DC

## 2019-07-12 NOTE — Patient Instructions (Signed)
Joint Pain  Joint pain can be caused by many things. It is likely to go away if you follow instructions from your doctor for taking care of yourself at home. Sometimes, you may need more treatment. Follow these instructions at home: Managing pain, stiffness, and swelling   If told, put ice on the painful area. ? Put ice in a plastic bag. ? Place a towel between your skin and the bag. ? Leave the ice on for 20 minutes, 2-3 times a day.  If told, put heat on the painful area. Do this as often as told by your doctor. Use the heat source that your doctor recommends, such as a moist heat pack or a heating pad. ? Place a towel between your skin and the heat source. ? Leave the heat on for 20-30 minutes. ? Take off the heat if your skin gets bright red. This is especially important if you are unable to feel pain, heat, or cold. You may have a greater risk of getting burned.  Move your fingers or toes below the painful joint often. This helps with stiffness and swelling.  If possible, raise (elevate) the painful joint above the level of your heart while you are sitting or lying down. To do this, try putting a few pillows under the painful joint. Activity  Rest the painful joint for as long as told. Do not do things that cause pain or make your pain worse.  Begin exercising or stretching the affected area, as told by your doctor. Ask your doctor what types of exercise are safe for you. If you have an elastic bandage, sling, or splint:  Wear the device as told by your doctor. Take it off only as told by your doctor.  Loosen the device if your fingers or toes below the joint: ? Tingle. ? Lose feeling (get numb). ? Get cold and blue.  Keep the device clean.  Ask your doctor if you should take off the device before bathing. You may need to cover it with a watertight covering when you take a bath or a shower. General instructions  Take over-the-counter and prescription medicines only as told  by your doctor.  Do not use any products that contain nicotine or tobacco. These include cigarettes and e-cigarettes. If you need help quitting, ask your doctor.  Keep all follow-up visits as told by your doctor. This is important. Contact a doctor if:  You have pain that gets worse and does not get better with medicine.  Your joint pain does not get better in 3 days.  You have more bruising or swelling.  You have a fever.  You lose 10 lb (4.5 kg) or more without trying. Get help right away if:  You cannot move the joint.  Your fingers or toes tingle, lose feeling, or get cold and blue.  You have a fever along with a joint that is red, warm, and swollen. Summary  Joint pain can be caused by many things. It often goes away if you follow instructions from your doctor for taking care of yourself at home.  Rest the painful joint for as long as told. Do not do things that cause pain or make your pain worse.  Take over-the-counter and prescription medicines only as told by your doctor. This information is not intended to replace advice given to you by your health care provider. Make sure you discuss any questions you have with your health care provider. Document Revised: 05/28/2017 Document Reviewed: 03/31/2017 Elsevier  Patient Education  [employer].

## 2019-07-12 NOTE — Progress Notes (Signed)
Subjective:  Patient ID: Jenny Brown, female    DOB: 1971/05/26, 49 y.o.   MRN: [id number]  Patient Care Team: Baruch Gouty, FNP as PCP - General (Family Medicine) Eustaquio Maize, MD (Inactive) (Pediatrics)   Chief Complaint:  Arthritis (multi-joint pain )   HPI: Jenny Brown is a 49 y.o. female presenting on 07/12/2019 for Arthritis (multi-joint pain )   Pt presents today for chronic pain in multiple joints. She has been evaluated and treated by neurology, neurosurgery, and pain management. She has had epidurals and injections without relief of symptoms. She has tried multiple oral medications without relief of symptoms or with adverse side effects that she was unable to tolerate. Pt states over the last few weeks the pain in her knees and elbows have worsened. States she is having trouble getting comfortable enough to sleep or rest. States she would just like answers.   Arthritis Presents for follow-up visit. She complains of pain, stiffness and joint swelling. She reports no joint warmth. The symptoms have been worsening. Affected locations include the right shoulder, left shoulder, right elbow, left elbow, left knee, right knee, left hip and right hip. Her pain is at a severity of 7/10. Associated symptoms include fatigue, pain at night and pain while resting. Pertinent negatives include no diarrhea, dry eyes, dry mouth, dysuria, fever, rash, Raynaud's syndrome, uveitis or weight loss.     Relevant past medical, surgical, family, and social history reviewed and updated as indicated.  Allergies and medications reviewed and updated. Date reviewed: Chart in Epic.   Past Medical History:  Diagnosis Date  . Essential hypertension, benign   . Hypertension   . OSA (obstructive sleep apnea)   . Restless legs syndrome   . Seizures ([place])     Past Surgical History:  Procedure Laterality Date  . DILITATION & CURRETTAGE/HYSTROSCOPY WITH NOVASURE ABLATION N/A 07/01/2017   Procedure:  DILATATION & CURETTAGE/HYSTEROSCOPY WITH NOVASURE ABLATION;  Surgeon: [name], MD;  Location: [facility];  Service: Gynecology;  Laterality: N/A;  dr. requests 10:00 start  . TUBAL LIGATION      Social History   Socioeconomic History  . Marital status: Married    Spouse name: Not on file  . Number of children: Not on file  . Years of education: Not on file  . Highest education level: Not on file  Occupational History  . Not on file  Tobacco Use  . Smoking status: Never Smoker  . Smokeless tobacco: Never Used  Substance and Sexual Activity  . Alcohol use: No  . Drug use: No  . Sexual activity: Yes    Birth control/protection: Surgical    Comment: tubal and ablation  Other Topics Concern  . Not on file  Social History Narrative   ** Merged History Encounter **       Lives with husband and 2 children in a one story home.  Works as a Optometrist.    Social Determinants of Health   Financial Resource Strain:   . Difficulty of Paying Living Expenses: Not on file  Food Insecurity:   . Worried About Charity fundraiser in the Last Year: Not on file  . Ran Out of Food in the Last Year: Not on file  Transportation Needs:   . Lack of Transportation (Medical): Not on file  . Lack of Transportation (Non-Medical): Not on file  Physical Activity:   . Days of Exercise per Week: Not on file  . Minutes  of Exercise per Session: Not on file  Stress:   . Feeling of Stress : Not on file  Social Connections:   . Frequency of Communication with Friends and Family: Not on file  . Frequency of Social Gatherings with Friends and Family: Not on file  . Attends Religious Services: Not on file  . Active Member of Clubs or Organizations: Not on file  . Attends Archivist Meetings: Not on file  . Marital Status: Not on file  Intimate Partner Violence:   . Fear of Current or Ex-Partner: Not on file  . Emotionally Abused: Not on file  . Physically Abused: Not on file  .  Sexually Abused: Not on file    Outpatient Encounter Medications as of 07/12/2019  Medication Sig  . lisinopril-hydrochlorothiazide (ZESTORETIC) 20-25 MG tablet TAKE 1 TABLET BY MOUTH ONCE DAILY (NEEDS  TO  BE  SEEN  FOR  REFILLS)  . phentermine (ADIPEX-P) 37.5 MG tablet Take 0.5 tablets (18.75 mg total) by mouth daily before breakfast.  . sertraline (ZOLOFT) 50 MG tablet Take 1 tablet (50 mg total) by mouth at bedtime. (Needs to be seen before next refill)  . DULoxetine (CYMBALTA) 30 MG capsule Take 1 capsule (30 mg total) by mouth daily.  . [DISCONTINUED] celecoxib (CELEBREX) 200 MG capsule Take by mouth.  . [DISCONTINUED] methocarbamol (ROBAXIN) 750 MG tablet TAKE 1 TABLET BY MOUTH 4 TIMES DAILY   No facility-administered encounter medications on file as of 07/12/2019.    No Known Allergies  Review of Systems  Constitutional: Positive for activity change and fatigue. Negative for appetite change, chills, diaphoresis, fever, unexpected weight change and weight loss.  HENT: Negative.   Eyes: Negative.  Negative for photophobia and visual disturbance.  Respiratory: Negative for cough, chest tightness and shortness of breath.   Cardiovascular: Negative for chest pain, palpitations and leg swelling.  Gastrointestinal: Negative for abdominal pain, blood in stool, constipation, diarrhea, nausea and vomiting.  Endocrine: Negative.   Genitourinary: Negative for decreased urine volume, difficulty urinating, dysuria, frequency and urgency.  Musculoskeletal: Positive for arthralgias, arthritis, back pain, joint swelling and stiffness. Negative for gait problem, myalgias, neck pain and neck stiffness.  Skin: Negative.  Negative for rash.  Allergic/Immunologic: Negative.   Neurological: Negative for dizziness, tremors, seizures, syncope, facial asymmetry, speech difficulty, weakness, light-headedness, numbness and headaches.  Hematological: Negative.   Psychiatric/Behavioral: Negative for  confusion, hallucinations, sleep disturbance and suicidal ideas.  All other systems reviewed and are negative.       Objective:  BP 117/79   Pulse 96   Temp 98.9 F (37.2 C)   Resp 20   Ht 5' (1.524 m)   Wt 168 lb (76.2 kg)   SpO2 98%   BMI 32.81 kg/m    Wt Readings from Last 3 Encounters:  07/12/19 168 lb (76.2 kg)  04/05/19 178 lb (80.7 kg)  01/03/19 188 lb 3.2 oz (85.4 kg)    Physical Exam Vitals and nursing note reviewed.  Constitutional:      General: She is not in acute distress.    Appearance: Normal appearance. She is well-developed and well-groomed. She is not ill-appearing, toxic-appearing or diaphoretic.  HENT:     Head: Normocephalic and atraumatic.     Jaw: There is normal jaw occlusion.     Right Ear: Hearing normal.     Left Ear: Hearing normal.     Nose: Nose normal.     Mouth/Throat:     Lips: Pink.  Mouth: Mucous membranes are moist.     Pharynx: Oropharynx is clear. Uvula midline.  Eyes:     General: Lids are normal.     Extraocular Movements: Extraocular movements intact.     Conjunctiva/sclera: Conjunctivae normal.     Pupils: Pupils are equal, round, and reactive to light.  Neck:     Thyroid: No thyroid mass, thyromegaly or thyroid tenderness.     Vascular: No carotid bruit or JVD.     Trachea: Trachea and phonation normal.  Cardiovascular:     Rate and Rhythm: Normal rate and regular rhythm.     Chest Wall: PMI is not displaced.     Pulses: Normal pulses.     Heart sounds: Normal heart sounds. No murmur. No friction rub. No gallop.   Pulmonary:     Effort: Pulmonary effort is normal. No respiratory distress.     Breath sounds: Normal breath sounds. No wheezing.  Abdominal:     General: Bowel sounds are normal. There is no distension or abdominal bruit.     Palpations: Abdomen is soft. There is no hepatomegaly or splenomegaly.     Tenderness: There is no abdominal tenderness. There is no right CVA tenderness or left CVA tenderness.      Hernia: No hernia is present.  Musculoskeletal:        General: No swelling, deformity or signs of injury. Normal range of motion.     Cervical back: Normal range of motion and neck supple.     Thoracic back: Normal.     Lumbar back: Tenderness present. No swelling or deformity. Normal range of motion. Negative right straight leg raise test and negative left straight leg raise test.     Right hip: Normal.     Left hip: Normal.     Right lower leg: No edema.     Left lower leg: No edema.  Lymphadenopathy:     Cervical: No cervical adenopathy.  Skin:    General: Skin is warm and dry.     Capillary Refill: Capillary refill takes less than 2 seconds.     Coloration: Skin is not cyanotic, jaundiced or pale.     Findings: No rash.  Neurological:     General: No focal deficit present.     Mental Status: She is alert and oriented to person, place, and time.     Cranial Nerves: Cranial nerves are intact. No cranial nerve deficit.     Sensory: Sensation is intact. No sensory deficit.     Motor: Motor function is intact. No weakness.     Coordination: Coordination is intact. Coordination normal.     Gait: Gait is intact. Gait normal.     Deep Tendon Reflexes: Reflexes are normal and symmetric. Reflexes normal.  Psychiatric:        Attention and Perception: Attention and perception normal.        Mood and Affect: Mood and affect normal.        Speech: Speech normal.        Behavior: Behavior normal. Behavior is cooperative.        Thought Content: Thought content normal.        Cognition and Memory: Cognition and memory normal.        Judgment: Judgment normal.     Results for orders placed or performed in visit on 01/03/19  CBC with Differential/Platelet  Result Value Ref Range   WBC 10.0 3.4 - 10.8 x10E3/uL   RBC 5.50 (H) 3.77 -  5.28 x10E6/uL   Hemoglobin 13.2 11.1 - 15.9 g/dL   Hematocrit 40.8 34.0 - 46.6 %   MCV 74 (L) 79 - 97 fL   MCH 24.0 (L) 26.6 - 33.0 pg   MCHC 32.4  31.5 - 35.7 g/dL   RDW 14.9 11.7 - 15.4 %   Platelets 337 150 - 450 x10E3/uL   Neutrophils 66 Not Estab. %   Lymphs 22 Not Estab. %   Monocytes 6 Not Estab. %   Eos 4 Not Estab. %   Basos 1 Not Estab. %   Neutrophils Absolute 6.6 1.4 - 7.0 x10E3/uL   Lymphocytes Absolute 2.2 0.7 - 3.1 x10E3/uL   Monocytes Absolute 0.6 0.1 - 0.9 x10E3/uL   EOS (ABSOLUTE) 0.4 0.0 - 0.4 x10E3/uL   Basophils Absolute 0.1 0.0 - 0.2 x10E3/uL   Immature Granulocytes 1 Not Estab. %   Immature Grans (Abs) 0.1 0.0 - 0.1 x10E3/uL  CMP14+EGFR  Result Value Ref Range   Glucose 110 (H) 65 - 99 mg/dL   BUN 16 6 - 24 mg/dL   Creatinine, Ser 0.87 0.57 - 1.00 mg/dL   GFR calc non Af Amer 80 >59 mL/min/1.73   GFR calc Af Amer 92 >59 mL/min/1.73   BUN/Creatinine Ratio 18 9 - 23   Sodium 137 134 - 144 mmol/L   Potassium 3.8 3.5 - 5.2 mmol/L   Chloride 98 96 - 106 mmol/L   CO2 22 20 - 29 mmol/L   Calcium 9.5 8.7 - 10.2 mg/dL   Total Protein 7.2 6.0 - 8.5 g/dL   Albumin 4.2 3.8 - 4.8 g/dL   Globulin, Total 3.0 1.5 - 4.5 g/dL   Albumin/Globulin Ratio 1.4 1.2 - 2.2   Bilirubin Total <0.2 0.0 - 1.2 mg/dL   Alkaline Phosphatase 77 39 - 117 IU/L   AST 18 0 - 40 IU/L   ALT 22 0 - 32 IU/L  Lipid panel  Result Value Ref Range   Cholesterol, Total 237 (H) 100 - 199 mg/dL   Triglycerides 326 (H) 0 - 149 mg/dL   HDL 39 (L) >39 mg/dL   VLDL Cholesterol Cal 65 (H) 5 - 40 mg/dL   LDL Calculated 133 (H) 0 - 99 mg/dL   Chol/HDL Ratio 6.1 (H) 0.0 - 4.4 ratio  Thyroid Panel With TSH  Result Value Ref Range   TSH 2.750 0.450 - 4.500 uIU/mL   T4, Total 6.7 4.5 - 12.0 ug/dL   T3 Uptake Ratio 22 (L) 24 - 39 %   Free Thyroxine Index 1.5 1.2 - 4.9       Pertinent labs & imaging results that were available during my care of the patient were reviewed by me and considered in my medical decision making.  Assessment & Plan:  Sh[place] was seen today for arthritis.  Diagnoses and all orders for this visit:  Chronic pain of  multiple joints Has tried and failed several treatments. Pt would like a more definitive answer for the ongoing arthralgias and myalgias. Will check ANA today along with Vit D and Vit B 12. Pt has not been placed on Cymbalta in the past. Will trial for chronic pain. Pt aware to keep follow up with pain management.  -     Arthritis Panel -     Vitamin D 25 hydroxy -     Vitamin B12 -     DULoxetine (CYMBALTA) 30 MG capsule; Take 1 capsule (30 mg total) by mouth daily.  Paresthesia Worsening paresthesias and arthralgias.  Will check for Vit B 12 and Vit D deficiencies.  -     Vitamin D 25 hydroxy -     Vitamin B12  High risk medication use Chronic use of NSAIDs and Tylenol. Will check renal and hepatic function today.  -     CMP14+EGFR     Continue all other maintenance medications.  Follow up plan: Return in about 6 weeks (around 08/23/2019), or if symptoms worsen or fail to improve.  Continue healthy lifestyle choices, including diet (rich in fruits, vegetables, and lean proteins, and low in salt and simple carbohydrates) and exercise (at least 30 minutes of moderate physical activity daily).  Educational handout given for chronic pain.   The above assessment and management plan was discussed with the patient. The patient verbalized understanding of and has agreed to the management plan. Patient is aware to call the clinic if they develop any new symptoms or if symptoms persist or worsen. Patient is aware when to return to the clinic for a follow-up visit. Patient educated on when it is appropriate to go to the emergency department.   Monia Pouch, FNP-C [facility] [phone]

## 2019-07-13 ENCOUNTER — Other Ambulatory Visit: Payer: Self-pay | Admitting: *Deleted

## 2019-07-13 DIAGNOSIS — N289 Disorder of kidney and ureter, unspecified: Secondary | ICD-10-CM

## 2019-07-13 LAB — ARTHRITIS PANEL
Basophils Absolute: 0.1 10*3/uL (ref 0.0–0.2)
Basos: 1 %
EOS (ABSOLUTE): 0.4 10*3/uL (ref 0.0–0.4)
Eos: 4 %
Hematocrit: 38 % (ref 34.0–46.6)
Hemoglobin: 12.6 g/dL (ref 11.1–15.9)
Immature Grans (Abs): 0 10*3/uL (ref 0.0–0.1)
Immature Granulocytes: 0 %
Lymphocytes Absolute: 2.4 10*3/uL (ref 0.7–3.1)
Lymphs: 27 %
MCH: 25.2 pg — ABNORMAL LOW (ref 26.6–33.0)
MCHC: 33.2 g/dL (ref 31.5–35.7)
MCV: 76 fL — ABNORMAL LOW (ref 79–97)
Monocytes Absolute: 0.5 10*3/uL (ref 0.1–0.9)
Monocytes: 5 %
Neutrophils Absolute: 5.6 10*3/uL (ref 1.4–7.0)
Neutrophils: 63 %
Platelets: 303 10*3/uL (ref 150–450)
RBC: 5 x10E6/uL (ref 3.77–5.28)
RDW: 13.2 % (ref 11.7–15.4)
Rheumatoid fact SerPl-aCnc: <10 IU/mL (ref 0.0–13.9)
Sed Rate: 34 mm/hr — ABNORMAL HIGH (ref 0–32)
Uric Acid: 4.4 mg/dL (ref 2.6–6.2)
WBC: 8.9 10*3/uL (ref 3.4–10.8)

## 2019-07-13 LAB — CMP14+EGFR
ALT: 17 IU/L (ref 0–32)
AST: 14 IU/L (ref 0–40)
Albumin/Globulin Ratio: 1.4 (ref 1.2–2.2)
Albumin: 4 g/dL (ref 3.8–4.8)
Alkaline Phosphatase: 68 IU/L (ref 39–117)
BUN/Creatinine Ratio: 9 (ref 9–23)
BUN: 10 mg/dL (ref 6–24)
Bilirubin Total: <0.2 mg/dL (ref 0.0–1.2)
CO2: 24 mmol/L (ref 20–29)
Calcium: 9.3 mg/dL (ref 8.7–10.2)
Chloride: 101 mmol/L (ref 96–106)
Creatinine, Ser: 1.14 mg/dL — ABNORMAL HIGH (ref 0.57–1.00)
GFR calc Af Amer: 66 mL/min/{1.73_m2} (ref >59)
GFR calc non Af Amer: 57 mL/min/{1.73_m2} — ABNORMAL LOW (ref >59)
Globulin, Total: 2.9 g/dL (ref 1.5–4.5)
Glucose: 75 mg/dL (ref 65–99)
Potassium: 3.4 mmol/L — ABNORMAL LOW (ref 3.5–5.2)
Sodium: 139 mmol/L (ref 134–144)
Total Protein: 6.9 g/dL (ref 6.0–8.5)

## 2019-07-13 LAB — VITAMIN B12: Vitamin B-12: 769 pg/mL (ref 232–1245)

## 2019-07-13 LAB — VITAMIN D 25 HYDROXY (VIT D DEFICIENCY, FRACTURES): Vit D, 25-Hydroxy: 18.6 ng/mL — ABNORMAL LOW (ref 30.0–100.0)

## 2019-07-13 MED ORDER — VITAMIN D (ERGOCALCIFEROL) 1.25 MG (50000 UNIT) PO CAPS: 50000.0000 [IU] | ORAL_CAPSULE | ORAL | 3 refills | Status: DC

## 2019-07-13 NOTE — Addendum Note (Signed)
Addended by: Baruch Gouty on: 07/13/2019 09:15 AM   Modules accepted: Orders

## 2019-07-16 ENCOUNTER — Other Ambulatory Visit: Payer: Self-pay | Admitting: Family Medicine

## 2019-07-16 DIAGNOSIS — Z6835 Body mass index (BMI) 35.0-35.9, adult: Secondary | ICD-10-CM

## 2019-07-18 ENCOUNTER — Other Ambulatory Visit: Payer: Self-pay | Admitting: Family Medicine

## 2019-07-18 DIAGNOSIS — Z6835 Body mass index (BMI) 35.0-35.9, adult: Secondary | ICD-10-CM

## 2019-07-20 ENCOUNTER — Encounter: Payer: Self-pay | Admitting: Family Medicine

## 2019-08-01 ENCOUNTER — Other Ambulatory Visit: Payer: Self-pay

## 2019-08-02 ENCOUNTER — Ambulatory Visit (INDEPENDENT_AMBULATORY_CARE_PROVIDER_SITE_OTHER): Payer: BC Managed Care – PPO | Admitting: Family Medicine

## 2019-08-02 ENCOUNTER — Encounter: Payer: Self-pay | Admitting: Family Medicine

## 2019-08-02 VITALS — BP 120/82 | HR 92 | Temp 96.8°F | Resp 20 | Ht 60.0 in | Wt 166.0 lb

## 2019-08-02 DIAGNOSIS — Z7689 Persons encountering health services in other specified circumstances: Secondary | ICD-10-CM | POA: Diagnosis not present

## 2019-08-02 DIAGNOSIS — F339 Major depressive disorder, recurrent, unspecified: Secondary | ICD-10-CM | POA: Diagnosis not present

## 2019-08-02 DIAGNOSIS — Z6832 Body mass index (BMI) 32.0-32.9, adult: Secondary | ICD-10-CM

## 2019-08-02 MED ORDER — PHENTERMINE HCL 37.5 MG PO TABS: 18.7500 mg | ORAL_TABLET | Freq: Every day | ORAL | 3 refills | Status: DC

## 2019-08-02 MED ORDER — SERTRALINE HCL 50 MG PO TABS: 100.0000 mg | ORAL_TABLET | Freq: Every day | ORAL | 1 refills | Status: DC

## 2019-08-02 NOTE — Progress Notes (Signed)
Subjective:  Patient ID: Jenny Brown, female    DOB: 08-03-1970, 49 y.o.   MRN: [id number]  Patient Care Team: Baruch Gouty, FNP as PCP - General (Family Medicine) Eustaquio Maize, MD (Inactive) (Pediatrics)   Chief Complaint:  Medical Management of Chronic Issues (weight mgmt)   HPI: Jenny Brown is a 49 y.o. female presenting on 08/02/2019 for Medical Management of Chronic Issues (weight mgmt)   1. Depression, recurrent ([place]) Pt reports an increase in depressive symptoms. States she feels COVID-19 has worsened this. She denies SI or HI. Has been taking medications as prescribed without associated side effects.  Depression screen [facility] 2/9 08/02/2019 07/12/2019 04/05/2019 01/03/2019 12/02/2018  Decreased Interest 2 0 0 0 0  Down, Depressed, Hopeless 1 0 0 0 0  PHQ - 2 Score 3 0 0 0 0  Altered sleeping 1 - 0 - 0  Tired, decreased energy 1 - 0 - 0  Change in appetite 0 - 0 - 0  Feeling bad or failure about yourself  1 - 0 - 0  Trouble concentrating 1 - 0 - 0  Moving slowly or fidgety/restless 0 - 0 - 0  Suicidal thoughts 0 - 0 - 0  PHQ-9 Score 7 - 0 - 0  Difficult doing work/chores - - - - -  Some recent data might be hidden     2. BMI 32.0-32.9,adult 3. Encounter for weight management Has done well since phentermine. Has been watching diet and walking every day. Has lost at least 15 pounds since last visit. Denies adverse side effects from the medications.      Relevant past medical, surgical, family, and social history reviewed and updated as indicated.  Allergies and medications reviewed and updated. Date reviewed: Chart in Epic.   Past Medical History:  Diagnosis Date  . Essential hypertension, benign   . Hypertension   . OSA (obstructive sleep apnea)   . Restless legs syndrome   . Seizures ([place])     Past Surgical History:  Procedure Laterality Date  . DILITATION & CURRETTAGE/HYSTROSCOPY WITH NOVASURE ABLATION N/A 07/01/2017   Procedure: DILATATION &  CURETTAGE/HYSTEROSCOPY WITH NOVASURE ABLATION;  Surgeon: [name], MD;  Location: [facility];  Service: Gynecology;  Laterality: N/A;  dr. requests 10:00 start  . TUBAL LIGATION      Social History   Socioeconomic History  . Marital status: Married    Spouse name: Not on file  . Number of children: Not on file  . Years of education: Not on file  . Highest education level: Not on file  Occupational History  . Not on file  Tobacco Use  . Smoking status: Never Smoker  . Smokeless tobacco: Never Used  Substance and Sexual Activity  . Alcohol use: No  . Drug use: No  . Sexual activity: Yes    Birth control/protection: Surgical    Comment: tubal and ablation  Other Topics Concern  . Not on file  Social History Narrative   ** Merged History Encounter **       Lives with husband and 2 children in a one story home.  Works as a Optometrist.    Social Determinants of Health   Financial Resource Strain:   . Difficulty of Paying Living Expenses: Not on file  Food Insecurity:   . Worried About Charity fundraiser in the Last Year: Not on file  . Ran Out of Food in the Last Year: Not on file  Transportation Needs:   . Film/video editor (Medical): Not on file  . Lack of Transportation (Non-Medical): Not on file  Physical Activity:   . Days of Exercise per Week: Not on file  . Minutes of Exercise per Session: Not on file  Stress:   . Feeling of Stress : Not on file  Social Connections:   . Frequency of Communication with Friends and Family: Not on file  . Frequency of Social Gatherings with Friends and Family: Not on file  . Attends Religious Services: Not on file  . Active Member of Clubs or Organizations: Not on file  . Attends Archivist Meetings: Not on file  . Marital Status: Not on file  Intimate Partner Violence:   . Fear of Current or Ex-Partner: Not on file  . Emotionally Abused: Not on file  . Physically Abused: Not on file  . Sexually  Abused: Not on file    Outpatient Encounter Medications as of 08/02/2019  Medication Sig  . DULoxetine (CYMBALTA) 30 MG capsule Take 1 capsule (30 mg total) by mouth daily.  Marland Kitchen lisinopril-hydrochlorothiazide (ZESTORETIC) 20-25 MG tablet TAKE 1 TABLET BY MOUTH ONCE DAILY (NEEDS  TO  BE  SEEN  FOR  REFILLS)  . sertraline (ZOLOFT) 50 MG tablet Take 2 tablets (100 mg total) by mouth at bedtime. (Needs to be seen before next refill)  . Vitamin D, Ergocalciferol, (DRISDOL) 1.25 MG (50000 UNIT) CAPS capsule Take 1 capsule (50,000 Units total) by mouth every 7 (seven) days.  . [DISCONTINUED] sertraline (ZOLOFT) 50 MG tablet Take 1 tablet (50 mg total) by mouth at bedtime. (Needs to be seen before next refill)  . phentermine (ADIPEX-P) 37.5 MG tablet Take 0.5 tablets (18.75 mg total) by mouth daily before breakfast.  . [DISCONTINUED] phentermine (ADIPEX-P) 37.5 MG tablet Take 0.5 tablets (18.75 mg total) by mouth daily before breakfast. (Patient not taking: Reported on 08/02/2019)  . [DISCONTINUED] phentermine (ADIPEX-P) 37.5 MG tablet Take 0.5 tablets (18.75 mg total) by mouth daily before breakfast.   No facility-administered encounter medications on file as of 08/02/2019.    No Known Allergies  Review of Systems  Constitutional: Positive for activity change and fatigue. Negative for appetite change, chills, diaphoresis, fever and unexpected weight change.  HENT: Negative.   Eyes: Negative.  Negative for photophobia and visual disturbance.  Respiratory: Negative for cough, chest tightness and shortness of breath.   Cardiovascular: Negative for chest pain, palpitations and leg swelling.  Gastrointestinal: Negative for abdominal pain, blood in stool, constipation, diarrhea, nausea and vomiting.  Endocrine: Negative.   Genitourinary: Negative for decreased urine volume, difficulty urinating, dysuria, frequency and urgency.  Musculoskeletal: Negative for arthralgias and myalgias.  Skin: Negative.     Allergic/Immunologic: Negative.   Neurological: Negative for dizziness, tremors, seizures, syncope, facial asymmetry, speech difficulty, weakness, light-headedness, numbness and headaches.  Hematological: Negative.   Psychiatric/Behavioral: Positive for decreased concentration, dysphoric mood and sleep disturbance. Negative for agitation, behavioral problems, confusion, hallucinations, self-injury and suicidal ideas. The patient is not nervous/anxious and is not hyperactive.   All other systems reviewed and are negative.       Objective:  BP 120/82   Pulse 92   Temp (!) 96.8 F (36 C)   Resp 20   Ht 5' (1.524 m)   Wt 166 lb (75.3 kg)   SpO2 98%   BMI 32.42 kg/m    Wt Readings from Last 3 Encounters:  08/02/19 166 lb (75.3 kg)  07/12/19 168 lb (76.2  kg)  04/05/19 178 lb (80.7 kg)    Physical Exam Vitals and nursing note reviewed.  Constitutional:      General: She is not in acute distress.    Appearance: Normal appearance. She is well-developed and well-groomed. She is obese. She is not ill-appearing, toxic-appearing or diaphoretic.  HENT:     Head: Normocephalic and atraumatic.     Jaw: There is normal jaw occlusion.     Right Ear: Hearing normal.     Left Ear: Hearing normal.     Nose: Nose normal.     Mouth/Throat:     Lips: Pink.     Mouth: Mucous membranes are moist.     Pharynx: Oropharynx is clear. Uvula midline.  Eyes:     General: Lids are normal.     Extraocular Movements: Extraocular movements intact.     Conjunctiva/sclera: Conjunctivae normal.     Pupils: Pupils are equal, round, and reactive to light.  Neck:     Thyroid: No thyroid mass, thyromegaly or thyroid tenderness.     Vascular: No carotid bruit or JVD.     Trachea: Trachea and phonation normal.  Cardiovascular:     Rate and Rhythm: Normal rate and regular rhythm.     Chest Wall: PMI is not displaced.     Pulses: Normal pulses.     Heart sounds: Normal heart sounds. No murmur. No friction  rub. No gallop.   Pulmonary:     Effort: Pulmonary effort is normal. No respiratory distress.     Breath sounds: Normal breath sounds. No wheezing.  Abdominal:     General: Bowel sounds are normal. There is no distension or abdominal bruit.     Palpations: Abdomen is soft. There is no hepatomegaly or splenomegaly.     Tenderness: There is no abdominal tenderness. There is no right CVA tenderness or left CVA tenderness.     Hernia: No hernia is present.  Musculoskeletal:        General: Normal range of motion.     Cervical back: Normal range of motion and neck supple.     Right lower leg: No edema.     Left lower leg: No edema.  Lymphadenopathy:     Cervical: No cervical adenopathy.  Skin:    General: Skin is warm and dry.     Capillary Refill: Capillary refill takes less than 2 seconds.     Coloration: Skin is not cyanotic, jaundiced or pale.     Findings: No rash.  Neurological:     General: No focal deficit present.     Mental Status: She is alert and oriented to person, place, and time.     Cranial Nerves: Cranial nerves are intact. No cranial nerve deficit.     Sensory: Sensation is intact. No sensory deficit.     Motor: Motor function is intact. No weakness.     Coordination: Coordination is intact. Coordination normal.     Gait: Gait is intact. Gait normal.     Deep Tendon Reflexes: Reflexes are normal and symmetric. Reflexes normal.  Psychiatric:        Attention and Perception: Attention and perception normal.        Mood and Affect: Mood and affect normal.        Speech: Speech normal.        Behavior: Behavior normal. Behavior is cooperative.        Thought Content: Thought content normal. Thought content does not include homicidal or suicidal  ideation. Thought content does not include homicidal or suicidal plan.        Cognition and Memory: Cognition and memory normal.        Judgment: Judgment normal.     Results for orders placed or performed in visit on 07/12/19   Arthritis Panel  Result Value Ref Range   Uric Acid 4.4 2.6 - 6.2 mg/dL   Rhuematoid fact SerPl-aCnc <10.0 0.0 - 13.9 IU/mL   WBC 8.9 3.4 - 10.8 x10E3/uL   RBC 5.00 3.77 - 5.28 x10E6/uL   Hemoglobin 12.6 11.1 - 15.9 g/dL   Hematocrit 38.0 34.0 - 46.6 %   MCV 76 (L) 79 - 97 fL   MCH 25.2 (L) 26.6 - 33.0 pg   MCHC 33.2 31.5 - 35.7 g/dL   RDW 13.2 11.7 - 15.4 %   Platelets 303 150 - 450 x10E3/uL   Neutrophils 63 Not Estab. %   Lymphs 27 Not Estab. %   Monocytes 5 Not Estab. %   Eos 4 Not Estab. %   Basos 1 Not Estab. %   Neutrophils Absolute 5.6 1.4 - 7.0 x10E3/uL   Lymphocytes Absolute 2.4 0.7 - 3.1 x10E3/uL   Monocytes Absolute 0.5 0.1 - 0.9 x10E3/uL   EOS (ABSOLUTE) 0.4 0.0 - 0.4 x10E3/uL   Basophils Absolute 0.1 0.0 - 0.2 x10E3/uL   Immature Granulocytes 0 Not Estab. %   Immature Grans (Abs) 0.0 0.0 - 0.1 x10E3/uL   Sed Rate 34 (H) 0 - 32 mm/hr  Vitamin D 25 hydroxy  Result Value Ref Range   Vit D, 25-Hydroxy 18.6 (L) 30.0 - 100.0 ng/mL  Vitamin B12  Result Value Ref Range   Vitamin B-12 769 232 - 1,245 pg/mL  CMP14+EGFR  Result Value Ref Range   Glucose 75 65 - 99 mg/dL   BUN 10 6 - 24 mg/dL   Creatinine, Ser 1.14 (H) 0.57 - 1.00 mg/dL   GFR calc non Af Amer 57 (L) >59 mL/min/1.73   GFR calc Af Amer 66 >59 mL/min/1.73   BUN/Creatinine Ratio 9 9 - 23   Sodium 139 134 - 144 mmol/L   Potassium 3.4 (L) 3.5 - 5.2 mmol/L   Chloride 101 96 - 106 mmol/L   CO2 24 20 - 29 mmol/L   Calcium 9.3 8.7 - 10.2 mg/dL   Total Protein 6.9 6.0 - 8.5 g/dL   Albumin 4.0 3.8 - 4.8 g/dL   Globulin, Total 2.9 1.5 - 4.5 g/dL   Albumin/Globulin Ratio 1.4 1.2 - 2.2   Bilirubin Total <0.2 0.0 - 1.2 mg/dL   Alkaline Phosphatase 68 39 - 117 IU/L   AST 14 0 - 40 IU/L   ALT 17 0 - 32 IU/L       Pertinent labs & imaging results that were available during my care of the patient were reviewed by me and considered in my medical decision making.  Assessment & Plan:  Jannine was seen today for  medical management of chronic issues.  Diagnoses and all orders for this visit:  Depression, recurrent ([place]) Has noticed an increase in symptoms. Willing to increase dose of SSRI, will change to 100 mg daily. Follow up in 3 months or sooner if needed.  -     sertraline (ZOLOFT) 50 MG tablet; Take 2 tablets (100 mg total) by mouth at bedtime. (Needs to be seen before next refill)  BMI 32.0-32.9,adult Encounter for weight management Doing well on below. Will continue for another 3 months and then hold  for a month to see if pt continues to progress. Diet and exercise encouraged. Follow up as discussed.  -     phentermine (ADIPEX-P) 37.5 MG tablet; Take 0.5 tablets (18.75 mg total) by mouth daily before breakfast.     Continue all other maintenance medications.  Follow up plan: Return in about 3 months (around 10/30/2019), or if symptoms worsen or fail to improve, for Vit D, weight.  Continue healthy lifestyle choices, including diet (rich in fruits, vegetables, and lean proteins, and low in salt and simple carbohydrates) and exercise (at least 30 minutes of moderate physical activity daily).  Educational handout given for exercise for weight loss  The above assessment and management plan was discussed with the patient. The patient verbalized understanding of and has agreed to the management plan. Patient is aware to call the clinic if they develop any new symptoms or if symptoms persist or worsen. Patient is aware when to return to the clinic for a follow-up visit. Patient educated on when it is appropriate to go to the emergency department.   Monia Pouch, FNP-C [facility] [phone]

## 2019-08-02 NOTE — Patient Instructions (Addendum)
5 HTP Black Cohosh  Colon Cleanse  Exercising to Lose Weight Exercise is structured, repetitive physical activity to improve fitness and health. Getting regular exercise is important for everyone. It is especially important if you are overweight. Being overweight increases your risk of heart disease, stroke, diabetes, high blood pressure, and several types of cancer. Reducing your calorie intake and exercising can help you lose weight. Exercise is usually categorized as moderate or vigorous intensity. To lose weight, most people need to do a certain amount of moderate-intensity or vigorous-intensity exercise each week. Moderate-intensity exercise  Moderate-intensity exercise is any activity that gets you moving enough to burn at least three times more energy (calories) than if you were sitting. Examples of moderate exercise include:  Walking a mile in 15 minutes.  Doing light yard work.  Biking at an easy pace. Most people should get at least 150 minutes (2 hours and 30 minutes) a week of moderate-intensity exercise to maintain their body weight. Vigorous-intensity exercise Vigorous-intensity exercise is any activity that gets you moving enough to burn at least six times more calories than if you were sitting. When you exercise at this intensity, you should be working hard enough that you are not able to carry on a conversation. Examples of vigorous exercise include:  Running.  Playing a team sport, such as football, basketball, and soccer.  Jumping rope. Most people should get at least 75 minutes (1 hour and 15 minutes) a week of vigorous-intensity exercise to maintain their body weight. How can exercise affect me? When you exercise enough to burn more calories than you eat, you lose weight. Exercise also reduces body fat and builds muscle. The more muscle you have, the more calories you burn. Exercise also:  Improves mood.  Reduces stress and tension.  Improves your overall  fitness, flexibility, and endurance.  Increases bone strength. The amount of exercise you need to lose weight depends on:  Your age.  The type of exercise.  Any health conditions you have.  Your overall physical ability. Talk to your health care provider about how much exercise you need and what types of activities are safe for you. What actions can I take to lose weight? Nutrition   Make changes to your diet as told by your health care provider or diet and nutrition specialist (dietitian). This may include: ? Eating fewer calories. ? Eating more protein. ? Eating less unhealthy fats. ? Eating a diet that includes fresh fruits and vegetables, whole grains, low-fat dairy products, and lean protein. ? Avoiding foods with added fat, salt, and sugar.  Drink plenty of water while you exercise to prevent dehydration or heat stroke. Activity  Choose an activity that you enjoy and set realistic goals. Your health care provider can help you make an exercise plan that works for you.  Exercise at a moderate or vigorous intensity most days of the week. ? The intensity of exercise may vary from person to person. You can tell how intense a workout is for you by paying attention to your breathing and heartbeat. Most people will notice their breathing and heartbeat get faster with more intense exercise.  Do resistance training twice each week, such as: ? Push-ups. ? Sit-ups. ? Lifting weights. ? Using resistance bands.  Getting short amounts of exercise can be just as helpful as long structured periods of exercise. If you have trouble finding time to exercise, try to include exercise in your daily routine. ? Get up, stretch, and walk around every 30  minutes throughout the day. ? Go for a walk during your lunch break. ? Park your car farther away from your destination. ? If you take public transportation, get off one stop early and walk the rest of the way. ? Make phone calls while standing  up and walking around. ? Take the stairs instead of elevators or escalators.  Wear comfortable clothes and shoes with good support.  Do not exercise so much that you hurt yourself, feel dizzy, or get very short of breath. Where to find more information  U.S. Department of Health and Human Services: BondedCompany.at  Centers for Disease Control and Prevention (CDC): http://www.wolf.info/ Contact a health care provider:  Before starting a new exercise program.  If you have questions or concerns about your weight.  If you have a medical problem that keeps you from exercising. Get help right away if you have any of the following while exercising:  Injury.  Dizziness.  Difficulty breathing or shortness of breath that does not go away when you stop exercising.  Chest pain.  Rapid heartbeat. Summary  Being overweight increases your risk of heart disease, stroke, diabetes, high blood pressure, and several types of cancer.  Losing weight happens when you burn more calories than you eat.  Reducing the amount of calories you eat in addition to getting regular moderate or vigorous exercise each week helps you lose weight. This information is not intended to replace advice given to you by your health care provider. Make sure you discuss any questions you have with your health care provider. Document Revised: 06/28/2017 Document Reviewed: 06/28/2017 Elsevier Patient Education  2020 [employer].

## 2019-08-04 ENCOUNTER — Other Ambulatory Visit: Payer: Self-pay | Admitting: Family Medicine

## 2019-08-04 ENCOUNTER — Telehealth: Payer: Self-pay | Admitting: *Deleted

## 2019-08-04 DIAGNOSIS — F339 Major depressive disorder, recurrent, unspecified: Secondary | ICD-10-CM

## 2019-08-04 MED ORDER — SERTRALINE HCL 100 MG PO TABS: 100.0000 mg | ORAL_TABLET | Freq: Every day | ORAL | 3 refills | Status: DC

## 2019-08-04 NOTE — Telephone Encounter (Signed)
Thought I changed this to 100 mg tabs. She is increasing her dose to 100 mg. I will send new RX.

## 2019-08-04 NOTE — Telephone Encounter (Signed)
Fax from [facility] [place] Sertraline 50 mg 2 tabs QHS Ins will cover 1 tab QD Please clarify

## 2019-09-12 ENCOUNTER — Other Ambulatory Visit: Payer: Self-pay | Admitting: Family Medicine

## 2019-09-12 DIAGNOSIS — I10 Essential (primary) hypertension: Secondary | ICD-10-CM

## 2019-10-26 ENCOUNTER — Ambulatory Visit: Payer: BC Managed Care – PPO | Admitting: Family Medicine

## 2019-10-27 ENCOUNTER — Ambulatory Visit: Payer: BC Managed Care – PPO | Admitting: Family Medicine

## 2020-01-09 ENCOUNTER — Other Ambulatory Visit: Payer: Self-pay | Admitting: *Deleted

## 2020-01-09 DIAGNOSIS — E559 Vitamin D deficiency, unspecified: Secondary | ICD-10-CM

## 2020-01-29 ENCOUNTER — Other Ambulatory Visit: Payer: Self-pay | Admitting: *Deleted

## 2020-01-29 DIAGNOSIS — E559 Vitamin D deficiency, unspecified: Secondary | ICD-10-CM

## 2020-07-09 DIAGNOSIS — M48061 Spinal stenosis, lumbar region without neurogenic claudication: Secondary | ICD-10-CM | POA: Insufficient documentation

## 2021-01-30 ENCOUNTER — Encounter: Payer: Self-pay | Admitting: Family Medicine

## 2021-01-30 ENCOUNTER — Other Ambulatory Visit: Payer: Self-pay

## 2021-01-30 ENCOUNTER — Ambulatory Visit: Payer: BC Managed Care – PPO | Admitting: Family Medicine

## 2021-01-30 VITALS — BP 208/130 | HR 72 | Temp 97.7°F | Ht 60.0 in | Wt 164.0 lb

## 2021-01-30 DIAGNOSIS — E559 Vitamin D deficiency, unspecified: Secondary | ICD-10-CM | POA: Insufficient documentation

## 2021-01-30 DIAGNOSIS — Z6832 Body mass index (BMI) 32.0-32.9, adult: Secondary | ICD-10-CM

## 2021-01-30 DIAGNOSIS — R202 Paresthesia of skin: Secondary | ICD-10-CM

## 2021-01-30 DIAGNOSIS — I10 Essential (primary) hypertension: Secondary | ICD-10-CM

## 2021-01-30 DIAGNOSIS — F339 Major depressive disorder, recurrent, unspecified: Secondary | ICD-10-CM | POA: Diagnosis not present

## 2021-01-30 MED ORDER — SERTRALINE HCL 50 MG PO TABS: 50.0000 mg | ORAL_TABLET | Freq: Every day | ORAL | 3 refills | Status: DC

## 2021-01-30 MED ORDER — LISINOPRIL-HYDROCHLOROTHIAZIDE 20-25 MG PO TABS: 1.0000 | ORAL_TABLET | Freq: Every day | ORAL | 3 refills | Status: AC

## 2021-01-30 NOTE — Patient Instructions (Signed)
DASH Eating Plan DASH stands for "Dietary Approaches to Stop Hypertension." The DASH eating plan is a healthy eating plan that has been shown to reduce high blood pressure (hypertension). Additional health benefits may include reducing the risk of type 2 diabetes mellitus, heart disease, and stroke. The DASH eating plan may also help with weight loss.  WHAT DO I NEED TO KNOW ABOUT THE DASH EATING PLAN? For the DASH eating plan, you will follow these general guidelines:  Choose foods with a percent daily value for sodium of less than 5% (as listed on the food label).  Use salt-free seasonings or herbs instead of table salt or sea salt.  Check with your health care provider or pharmacist before using salt substitutes.  Eat lower-sodium products, often labeled as "lower sodium" or "no salt added."  Eat fresh foods.  Eat more vegetables, fruits, and low-fat dairy products.  Choose whole grains. Look for the word "whole" as the first word in the ingredient list.  Choose fish and skinless chicken or turkey more often than red meat. Limit fish, poultry, and meat to 6 oz (170 g) each day.  Limit sweets, desserts, sugars, and sugary drinks.  Choose heart-healthy fats.  Limit cheese to 1 oz (28 g) per day.  Eat more home-cooked food and less restaurant, buffet, and fast food.  Limit fried foods.  Cook foods using methods other than frying.  Limit canned vegetables. If you do use them, rinse them well to decrease the sodium.  When eating at a restaurant, ask that your food be prepared with less salt, or no salt if possible.  WHAT FOODS CAN I EAT? Seek help from a dietitian for individual calorie needs.  Grains Whole grain or whole wheat bread. Sottile rice. Whole grain or whole wheat pasta. Quinoa, bulgur, and whole grain cereals. Low-sodium cereals. Corn or whole wheat flour tortillas. Whole grain cornbread. Whole grain crackers. Low-sodium crackers.  Vegetables Fresh or frozen  vegetables (raw, steamed, roasted, or grilled). Low-sodium or reduced-sodium tomato and vegetable juices. Low-sodium or reduced-sodium tomato sauce and paste. Low-sodium or reduced-sodium canned vegetables.   Fruits All fresh, canned (in natural juice), or frozen fruits.  Meat and Other Protein Products Ground beef (85% or leaner), grass-fed beef, or beef trimmed of fat. Skinless chicken or turkey. Ground chicken or turkey. Pork trimmed of fat. All fish and seafood. Eggs. Dried beans, peas, or lentils. Unsalted nuts and seeds. Unsalted canned beans.  Dairy Low-fat dairy products, such as skim or 1% milk, 2% or reduced-fat cheeses, low-fat ricotta or cottage cheese, or plain low-fat yogurt. Low-sodium or reduced-sodium cheeses.  Fats and Oils Tub margarines without trans fats. Light or reduced-fat mayonnaise and salad dressings (reduced sodium). Avocado. Safflower, olive, or canola oils. Natural peanut or almond butter.  Other Unsalted popcorn and pretzels. The items listed above may not be a complete list of recommended foods or beverages. Contact your dietitian for more options.  WHAT FOODS ARE NOT RECOMMENDED?  Grains White bread. White pasta. White rice. Refined cornbread. Bagels and croissants. Crackers that contain trans fat.  Vegetables Creamed or fried vegetables. Vegetables in a cheese sauce. Regular canned vegetables. Regular canned tomato sauce and paste. Regular tomato and vegetable juices.  Fruits Dried fruits. Canned fruit in light or heavy syrup. Fruit juice.  Meat and Other Protein Products Fatty cuts of meat. Ribs, chicken wings, bacon, sausage, bologna, salami, chitterlings, fatback, hot dogs, bratwurst, and packaged luncheon meats. Salted nuts and seeds. Canned beans with salt.    Dairy Whole or 2% milk, cream, half-and-half, and cream cheese. Whole-fat or sweetened yogurt. Full-fat cheeses or blue cheese. Nondairy creamers and whipped toppings. Processed cheese,  cheese spreads, or cheese curds.  Condiments Onion and garlic salt, seasoned salt, table salt, and sea salt. Canned and packaged gravies. Worcestershire sauce. Tartar sauce. Barbecue sauce. Teriyaki sauce. Soy sauce, including reduced sodium. Steak sauce. Fish sauce. Oyster sauce. Cocktail sauce. Horseradish. Ketchup and mustard. Meat flavorings and tenderizers. Bouillon cubes. Hot sauce. Tabasco sauce. Marinades. Taco seasonings. Relishes.  Fats and Oils Butter, stick margarine, lard, shortening, ghee, and bacon fat. Coconut, palm kernel, or palm oils. Regular salad dressings.  Other Pickles and olives. Salted popcorn and pretzels.  The items listed above may not be a complete list of foods and beverages to avoid. Contact your dietitian for more information.  WHERE CAN I FIND MORE INFORMATION? National Heart, Lung, and Blood Institute: www.nhlbi.nih.gov/health/health-topics/topics/dash/ Document Released: 06/04/2011 Document Revised: 10/30/2013 Document Reviewed: 04/19/2013 ExitCare Patient Information 2015 [employer]. This information is not intended to replace advice given to you by your health care provider. Make sure you discuss any questions you have with your health care provider.   I think that you would greatly benefit from seeing a nutritionist.  If you are interested, please call Dr Sykes at [phone] to schedule an appointment.   

## 2021-01-30 NOTE — Progress Notes (Signed)
Subjective:  Patient ID: Jenny Brown, female    DOB: 02-04-1971, 50 y.o.   MRN: [id number]  Patient Care Team: Baruch Gouty, FNP as PCP - General (Family Medicine)   Chief Complaint:  Medical Management of Chronic Issues   HPI: Jenny Brown is a 50 y.o. female presenting on 01/30/2021 for Medical Management of Chronic Issues. Has not been seen in over a year. Has not been on medications for several months.    1. Uncontrolled hypertension Complaint with meds - No, has not been taking medications for several months Current Medications - none Checking BP at home ranging no Exercising Regularly - No Watching Salt intake - No Pertinent ROS:  Headache - Yes Fatigue - Yes Visual Disturbances - No Chest pain - No Dyspnea - No Palpitations - No LE edema - No  Family, social, and smoking history reviewed.   BP Readings from Last 3 Encounters:  01/30/21 (!) 208/130  08/02/19 120/82  07/12/19 117/79   Last labs 06/2019.  2. Depression, recurrent (HCC) Pt has not been taking medications for several months, ran out and did not follow up for refills. States she has had increased depression and anxiety over the last several weeks to months. PHQ9 and GAD7 as noted. Denies SI/HI. Depression screen [facility] 2/9 01/30/2021 08/02/2019 07/12/2019 04/05/2019 01/03/2019  Decreased Interest 2 2 0 0 0  Down, Depressed, Hopeless 2 1 0 0 0  PHQ - 2 Score 4 3 0 0 0  Altered sleeping 3 1 - 0 -  Tired, decreased energy 3 1 - 0 -  Change in appetite 3 0 - 0 -  Feeling bad or failure about yourself  2 1 - 0 -  Trouble concentrating 2 1 - 0 -  Moving slowly or fidgety/restless 2 0 - 0 -  Suicidal thoughts 0 0 - 0 -  PHQ-9 Score 19 7 - 0 -  Difficult doing work/chores Extremely dIfficult - - - -  Some recent data might be hidden   GAD 7 : Generalized Anxiety Score 01/30/2021 04/05/2019  Nervous, Anxious, on Edge 3 0  Control/stop worrying 3 0  Worry too much - different things 3 0  Trouble relaxing 3 0   Restless 3 0  Easily annoyed or irritable 3 0  Afraid - awful might happen 3 0  Total GAD 7 Score 21 0  Anxiety Difficulty Extremely difficult -    3. Paresthesia Pt reports tingling sensations in bilateral hands and feet for several weeks. No know injury or repetitive motions. No loss of function. Has not tried anything for the symptoms.   4. Vitamin D deficiency Pt is not taking oral repletion therapy as prescribed. Has been out for several months. Reports fatigue, depression, and paresthesias of hands and feet.  Lab Results  Component Value Date   VD25OH 18.6 (L) 07/12/2019   VD25OH 39.7 03/12/2014   Lab Results  Component Value Date   CALCIUM 9.3 07/12/2019     Relevant past medical, surgical, family, and social history reviewed and updated as indicated.  Allergies and medications reviewed and updated. Date reviewed: Chart in Epic.   Past Medical History:  Diagnosis Date   Essential hypertension, benign    Hypertension    OSA (obstructive sleep apnea)    Restless legs syndrome    Seizures ([place])     Past Surgical History:  Procedure Laterality Date   DILITATION & CURRETTAGE/HYSTROSCOPY WITH NOVASURE ABLATION N/A 07/01/2017   Procedure: DILATATION &  CURETTAGE/HYSTEROSCOPY WITH NOVASURE ABLATION;  Surgeon: [name], MD;  Location: [facility];  Service: Gynecology;  Laterality: N/A;  dr. requests 10:00 start   TUBAL LIGATION      Social History   Socioeconomic History   Marital status: Married    Spouse name: Not on file   Number of children: Not on file   Years of education: Not on file   Highest education level: Not on file  Occupational History   Not on file  Tobacco Use   Smoking status: Never   Smokeless tobacco: Never  Vaping Use   Vaping Use: Never used  Substance and Sexual Activity   Alcohol use: No   Drug use: No   Sexual activity: Yes    Birth control/protection: Surgical    Comment: tubal and ablation  Other Topics Concern   Not on file   Social History Narrative   ** Merged History Encounter **       Lives with husband and 2 children in a one story home.  Works as a Optometrist.    Social Determinants of Health   Financial Resource Strain: Not on file  Food Insecurity: Not on file  Transportation Needs: Not on file  Physical Activity: Not on file  Stress: Not on file  Social Connections: Not on file  Intimate Partner Violence: Not on file    Outpatient Encounter Medications as of 01/30/2021  Medication Sig   lisinopril-hydrochlorothiazide (ZESTORETIC) 20-25 MG tablet Take 1 tablet by mouth daily.   sertraline (ZOLOFT) 50 MG tablet Take 1 tablet (50 mg total) by mouth at bedtime.   [DISCONTINUED] DULoxetine (CYMBALTA) 30 MG capsule Take 1 capsule (30 mg total) by mouth daily.   [DISCONTINUED] lisinopril-hydrochlorothiazide (ZESTORETIC) 20-25 MG tablet TAKE 1 TABLET BY MOUTH ONCE DAILY   [DISCONTINUED] phentermine (ADIPEX-P) 37.5 MG tablet Take 0.5 tablets (18.75 mg total) by mouth daily before breakfast.   [DISCONTINUED] sertraline (ZOLOFT) 100 MG tablet Take 1 tablet (100 mg total) by mouth daily.   [DISCONTINUED] sertraline (ZOLOFT) 50 MG tablet Take 2 tablets (100 mg total) by mouth at bedtime. (Needs to be seen before next refill)   [DISCONTINUED] Vitamin D, Ergocalciferol, (DRISDOL) 1.25 MG (50000 UNIT) CAPS capsule Take 1 capsule (50,000 Units total) by mouth every 7 (seven) days.   No facility-administered encounter medications on file as of 01/30/2021.    Allergies  Allergen Reactions   Methylprednisolone     Review of Systems  Constitutional:  Positive for activity change, diaphoresis (at night) and fatigue. Negative for appetite change, chills and fever.  HENT: Negative.    Eyes: Negative.   Respiratory:  Negative for cough, chest tightness and shortness of breath.   Cardiovascular:  Negative for chest pain, palpitations and leg swelling.  Gastrointestinal:  Negative for blood in stool,  constipation, diarrhea, nausea and vomiting.  Endocrine: Negative.  Negative for cold intolerance and heat intolerance.  Genitourinary:  Negative for dysuria, frequency and urgency.  Musculoskeletal:  Negative for arthralgias, back pain, myalgias and neck pain.  Skin: Negative.   Allergic/Immunologic: Negative.   Neurological:  Positive for numbness (paresthesias to hands and feet) and headaches. Negative for dizziness, tremors, seizures, syncope, facial asymmetry, speech difficulty, weakness and light-headedness.  Hematological: Negative.   Psychiatric/Behavioral:  Positive for decreased concentration, dysphoric mood and sleep disturbance. Negative for agitation, behavioral problems, confusion, hallucinations, self-injury and suicidal ideas. The patient is nervous/anxious. The patient is not hyperactive.   All other systems reviewed and are negative.  Objective:  BP (!) 208/130   Pulse 72   Temp 97.7 F (36.5 C) (Temporal)   Ht 5' (1.524 m)   Wt 164 lb (74.4 kg)   BMI 32.03 kg/m    Wt Readings from Last 3 Encounters:  01/30/21 164 lb (74.4 kg)  08/02/19 166 lb (75.3 kg)  07/12/19 168 lb (76.2 kg)    Physical Exam Vitals and nursing note reviewed.  Constitutional:      General: She is not in acute distress.    Appearance: Normal appearance. She is well-developed and well-groomed. She is obese. She is not ill-appearing, toxic-appearing or diaphoretic.  HENT:     Head: Normocephalic and atraumatic.     Jaw: There is normal jaw occlusion.     Right Ear: Hearing normal.     Left Ear: Hearing normal.     Nose: Nose normal.     Mouth/Throat:     Lips: Pink.     Mouth: Mucous membranes are moist.     Pharynx: Oropharynx is clear. Uvula midline.  Eyes:     General: Lids are normal.     Extraocular Movements: Extraocular movements intact.     Conjunctiva/sclera: Conjunctivae normal.     Pupils: Pupils are equal, round, and reactive to light.  Neck:     Thyroid: No thyroid  mass, thyromegaly or thyroid tenderness.     Vascular: No carotid bruit or JVD.     Trachea: Trachea and phonation normal.  Cardiovascular:     Rate and Rhythm: Normal rate and regular rhythm.     Chest Wall: PMI is not displaced.     Pulses: Normal pulses.     Heart sounds: Normal heart sounds. No murmur heard.   No friction rub. No gallop.  Pulmonary:     Effort: Pulmonary effort is normal. No respiratory distress.     Breath sounds: Normal breath sounds. No wheezing.  Abdominal:     General: Bowel sounds are normal. There is no distension or abdominal bruit.     Palpations: Abdomen is soft. There is no hepatomegaly or splenomegaly.     Tenderness: There is no abdominal tenderness. There is no right CVA tenderness or left CVA tenderness.     Hernia: No hernia is present.  Musculoskeletal:        General: Normal range of motion.     Cervical back: Normal range of motion and neck supple.     Right lower leg: No edema.     Left lower leg: No edema.  Lymphadenopathy:     Cervical: No cervical adenopathy.  Skin:    General: Skin is warm and dry.     Capillary Refill: Capillary refill takes less than 2 seconds.     Coloration: Skin is not cyanotic, jaundiced or pale.     Findings: No bruising, erythema or rash.  Neurological:     General: No focal deficit present.     Mental Status: She is alert and oriented to person, place, and time.     Cranial Nerves: Cranial nerves are intact. No cranial nerve deficit.     Sensory: Sensation is intact. No sensory deficit.     Motor: Motor function is intact. No weakness.     Coordination: Coordination is intact. Coordination normal.     Gait: Gait is intact. Gait normal.     Deep Tendon Reflexes: Reflexes are normal and symmetric. Reflexes normal.  Psychiatric:        Attention and Perception: Attention  and perception normal.        Mood and Affect: Mood is depressed. Affect is flat.        Speech: Speech normal.        Behavior: Behavior  normal. Behavior is cooperative.        Thought Content: Thought content normal.        Cognition and Memory: Cognition and memory normal.        Judgment: Judgment normal.    Results for orders placed or performed in visit on 07/12/19  Arthritis Panel  Result Value Ref Range   Uric Acid 4.4 2.6 - 6.2 mg/dL   Rhuematoid fact SerPl-aCnc <10.0 0.0 - 13.9 IU/mL   WBC 8.9 3.4 - 10.8 x10E3/uL   RBC 5.00 3.77 - 5.28 x10E6/uL   Hemoglobin 12.6 11.1 - 15.9 g/dL   Hematocrit 38.0 34.0 - 46.6 %   MCV 76 (L) 79 - 97 fL   MCH 25.2 (L) 26.6 - 33.0 pg   MCHC 33.2 31.5 - 35.7 g/dL   RDW 13.2 11.7 - 15.4 %   Platelets 303 150 - 450 x10E3/uL   Neutrophils 63 Not Estab. %   Lymphs 27 Not Estab. %   Monocytes 5 Not Estab. %   Eos 4 Not Estab. %   Basos 1 Not Estab. %   Neutrophils Absolute 5.6 1.4 - 7.0 x10E3/uL   Lymphocytes Absolute 2.4 0.7 - 3.1 x10E3/uL   Monocytes Absolute 0.5 0.1 - 0.9 x10E3/uL   EOS (ABSOLUTE) 0.4 0.0 - 0.4 x10E3/uL   Basophils Absolute 0.1 0.0 - 0.2 x10E3/uL   Immature Granulocytes 0 Not Estab. %   Immature Grans (Abs) 0.0 0.0 - 0.1 x10E3/uL   Sed Rate 34 (H) 0 - 32 mm/hr  Vitamin D 25 hydroxy  Result Value Ref Range   Vit D, 25-Hydroxy 18.6 (L) 30.0 - 100.0 ng/mL  Vitamin B12  Result Value Ref Range   Vitamin B-12 769 232 - 1,245 pg/mL  CMP14+EGFR  Result Value Ref Range   Glucose 75 65 - 99 mg/dL   BUN 10 6 - 24 mg/dL   Creatinine, Ser 1.14 (H) 0.57 - 1.00 mg/dL   GFR calc non Af Amer 57 (L) >59 mL/min/1.73   GFR calc Af Amer 66 >59 mL/min/1.73   BUN/Creatinine Ratio 9 9 - 23   Sodium 139 134 - 144 mmol/L   Potassium 3.4 (L) 3.5 - 5.2 mmol/L   Chloride 101 96 - 106 mmol/L   CO2 24 20 - 29 mmol/L   Calcium 9.3 8.7 - 10.2 mg/dL   Total Protein 6.9 6.0 - 8.5 g/dL   Albumin 4.0 3.8 - 4.8 g/dL   Globulin, Total 2.9 1.5 - 4.5 g/dL   Albumin/Globulin Ratio 1.4 1.2 - 2.2   Bilirubin Total <0.2 0.0 - 1.2 mg/dL   Alkaline Phosphatase 68 39 - 117 IU/L   AST 14 0  - 40 IU/L   ALT 17 0 - 32 IU/L       Pertinent labs & imaging results that were available during my care of the patient were reviewed by me and considered in my medical decision making.  Assessment & Plan:  Jenny Brown was seen today for medical management of chronic issues.  Diagnoses and all orders for this visit:  Uncontrolled hypertension Manual BP 198/92. Has not been taking medications. Medication compliance discussed in detail. No recent labs, will obtain today. DASH diet and exercise discussed. Monitor BP at home and report any persistent high  or low readings. Medications as prescribed.  -     CBC with Differential/Platelet -     CMP14+EGFR -     Thyroid Panel With TSH -     Microalbumin / creatinine urine ratio -     Lipid panel -     lisinopril-hydrochlorothiazide (ZESTORETIC) 20-25 MG tablet; Take 1 tablet by mouth daily.  Depression, recurrent ([place]) No SI or HI. Will check thyroid, Vit D and Vit B12. Restart sertraline at 50 mg nightly. Follow up in 2-3 weeks for reevaluation. Will adjust regimen as necessary.  -     Thyroid Panel With TSH -     VITAMIN D 25 Hydroxy (Vit-D Deficiency, Fractures) -     Vitamin B12 -     sertraline (ZOLOFT) 50 MG tablet; Take 1 tablet (50 mg total) by mouth at bedtime.  Paresthesia In bilateral hands and feet, will check below labs and treat if warranted. Report any new or worsening symptoms.  -     CBC with Differential/Platelet -     CMP14+EGFR -     VITAMIN D 25 Hydroxy (Vit-D Deficiency, Fractures) -     Vitamin B12  Vitamin D deficiency Not on repletion therapy. Will check labs and restart therapy if warranted.  -     CBC with Differential/Platelet -     VITAMIN D 25 Hydroxy (Vit-D Deficiency, Fractures)  BMI 32.0-32.9,adult Diet and exercise encouraged. Labs pending.  -     CBC with Differential/Platelet -     CMP14+EGFR -     Thyroid Panel With TSH -     Lipid panel    Continue all other maintenance medications.  Follow up  plan: Return in about 3 weeks (around 02/20/2021) for HTN, Labs.   Continue healthy lifestyle choices, including diet (rich in fruits, vegetables, and lean proteins, and low in salt and simple carbohydrates) and exercise (at least 30 minutes of moderate physical activity daily).  Educational handout given for hypertension and DASH diet  The above assessment and management plan was discussed with the patient. The patient verbalized understanding of and has agreed to the management plan. Patient is aware to call the clinic if they develop any new symptoms or if symptoms persist or worsen. Patient is aware when to return to the clinic for a follow-up visit. Patient educated on when it is appropriate to go to the emergency department.   Monia Pouch, FNP-C [facility] [phone]

## 2021-01-31 LAB — CBC WITH DIFFERENTIAL/PLATELET
Basophils Absolute: 0 10*3/uL (ref 0.0–0.2)
Basos: 1 %
EOS (ABSOLUTE): 0.3 10*3/uL (ref 0.0–0.4)
Eos: 4 %
Hematocrit: 42.6 % (ref 34.0–46.6)
Hemoglobin: 13.6 g/dL (ref 11.1–15.9)
Immature Grans (Abs): 0 10*3/uL (ref 0.0–0.1)
Immature Granulocytes: 0 %
Lymphocytes Absolute: 2.2 10*3/uL (ref 0.7–3.1)
Lymphs: 31 %
MCH: 24.9 pg — ABNORMAL LOW (ref 26.6–33.0)
MCHC: 31.9 g/dL (ref 31.5–35.7)
MCV: 78 fL — ABNORMAL LOW (ref 79–97)
Monocytes Absolute: 0.5 10*3/uL (ref 0.1–0.9)
Monocytes: 7 %
Neutrophils Absolute: 4.1 10*3/uL (ref 1.4–7.0)
Neutrophils: 57 %
Platelets: 304 10*3/uL (ref 150–450)
RBC: 5.46 x10E6/uL — ABNORMAL HIGH (ref 3.77–5.28)
RDW: 13.9 % (ref 11.7–15.4)
WBC: 7.2 10*3/uL (ref 3.4–10.8)

## 2021-01-31 LAB — VITAMIN D 25 HYDROXY (VIT D DEFICIENCY, FRACTURES): Vit D, 25-Hydroxy: 26.3 ng/mL — ABNORMAL LOW (ref 30.0–100.0)

## 2021-01-31 LAB — LIPID PANEL
Chol/HDL Ratio: 4.2 ratio (ref 0.0–4.4)
Cholesterol, Total: 219 mg/dL — ABNORMAL HIGH (ref 100–199)
HDL: 52 mg/dL (ref >39)
LDL Chol Calc (NIH): 152 mg/dL — ABNORMAL HIGH (ref 0–99)
Triglycerides: 83 mg/dL (ref 0–149)
VLDL Cholesterol Cal: 15 mg/dL (ref 5–40)

## 2021-01-31 LAB — CMP14+EGFR
ALT: 8 IU/L (ref 0–32)
AST: 14 IU/L (ref 0–40)
Albumin/Globulin Ratio: 1.4 (ref 1.2–2.2)
Albumin: 4.4 g/dL (ref 3.8–4.8)
Alkaline Phosphatase: 81 IU/L (ref 44–121)
BUN/Creatinine Ratio: 13 (ref 9–23)
BUN: 11 mg/dL (ref 6–24)
Bilirubin Total: 0.3 mg/dL (ref 0.0–1.2)
CO2: 27 mmol/L (ref 20–29)
Calcium: 10.1 mg/dL (ref 8.7–10.2)
Chloride: 102 mmol/L (ref 96–106)
Creatinine, Ser: 0.82 mg/dL (ref 0.57–1.00)
Globulin, Total: 3.1 g/dL (ref 1.5–4.5)
Glucose: 81 mg/dL (ref 65–99)
Potassium: 4.6 mmol/L (ref 3.5–5.2)
Sodium: 142 mmol/L (ref 134–144)
Total Protein: 7.5 g/dL (ref 6.0–8.5)
eGFR: 88 mL/min/{1.73_m2} (ref >59)

## 2021-01-31 LAB — MICROALBUMIN / CREATININE URINE RATIO
Creatinine, Urine: 42.2 mg/dL
Microalb/Creat Ratio: 56 mg/g creat — ABNORMAL HIGH (ref 0–29)
Microalbumin, Urine: 23.5 ug/mL

## 2021-01-31 LAB — THYROID PANEL WITH TSH
Free Thyroxine Index: 1.7 (ref 1.2–4.9)
T3 Uptake Ratio: 24 % (ref 24–39)
T4, Total: 7.1 ug/dL (ref 4.5–12.0)
TSH: 1.69 u[IU]/mL (ref 0.450–4.500)

## 2021-01-31 LAB — VITAMIN B12: Vitamin B-12: 453 pg/mL (ref 232–1245)

## 2021-01-31 NOTE — Progress Notes (Signed)
Vit D is low, restart OTC Vit D 2000 units per day. Cholesterol is slightly elevated with TC 219 and LDL 152. Need to make dietary changes and start exercising. Will recheck in 6 months after dietary changes. If still elevated, will start medications.   Diet encouraged - increase intake of fresh fruits and vegetables, increase intake of lean proteins. Bake, broil, or grill foods. Avoid fried, greasy, and fatty foods. Avoid fast foods. Increase intake of fiber-rich whole grains. Exercise encouraged - at least 150 minutes per week and advance as tolerated.  Goal BMI < 25.

## 2021-02-05 ENCOUNTER — Telehealth: Payer: Self-pay | Admitting: Family Medicine

## 2021-02-05 NOTE — Telephone Encounter (Signed)
Spoke with patient, explained lab results to her and patient voiced understanding.

## 2021-02-20 ENCOUNTER — Ambulatory Visit: Payer: BC Managed Care – PPO | Admitting: Family Medicine

## 2021-07-18 ENCOUNTER — Telehealth: Payer: Self-pay

## 2021-07-18 NOTE — Telephone Encounter (Signed)
Patient's blood pressure has been fluctuating recently and she is unsure what to do.  Last night it was 220/110, this morning after taking medication it was 100/80 and patient felt dizzy.  I scheduled her an appointment in your first available next Tuesday but patient would like to know what to do in the meantime.

## 2021-07-18 NOTE — Telephone Encounter (Signed)
Contacted and advised per Monia Pouch, FNP

## 2021-07-22 ENCOUNTER — Ambulatory Visit: Payer: BC Managed Care – PPO | Admitting: Family Medicine

## 2021-07-22 ENCOUNTER — Ambulatory Visit (INDEPENDENT_AMBULATORY_CARE_PROVIDER_SITE_OTHER): Payer: BC Managed Care – PPO

## 2021-07-22 ENCOUNTER — Encounter: Payer: Self-pay | Admitting: Family Medicine

## 2021-07-22 VITALS — BP 157/85 | HR 71 | Temp 98.6°F | Ht 60.0 in | Wt 165.0 lb

## 2021-07-22 DIAGNOSIS — R062 Wheezing: Secondary | ICD-10-CM

## 2021-07-22 DIAGNOSIS — D229 Melanocytic nevi, unspecified: Secondary | ICD-10-CM

## 2021-07-22 DIAGNOSIS — I10 Essential (primary) hypertension: Secondary | ICD-10-CM

## 2021-07-22 DIAGNOSIS — F339 Major depressive disorder, recurrent, unspecified: Secondary | ICD-10-CM

## 2021-07-22 DIAGNOSIS — E559 Vitamin D deficiency, unspecified: Secondary | ICD-10-CM

## 2021-07-22 DIAGNOSIS — F411 Generalized anxiety disorder: Secondary | ICD-10-CM

## 2021-07-22 MED ORDER — SERTRALINE HCL 100 MG PO TABS: 100.0000 mg | ORAL_TABLET | Freq: Every day | ORAL | 3 refills | Status: AC

## 2021-07-22 MED ORDER — METHYLPREDNISOLONE ACETATE 40 MG/ML IJ SUSP
40.0000 mg | Freq: Once | INTRAMUSCULAR | Status: AC
  Administered 2021-07-22: 09:00:00 40 mg via INTRAMUSCULAR

## 2021-07-22 MED ORDER — LISINOPRIL 10 MG PO TABS: 10.0000 mg | ORAL_TABLET | Freq: Every day | ORAL | 3 refills | Status: AC

## 2021-07-22 MED ORDER — ALBUTEROL SULFATE HFA 108 (90 BASE) MCG/ACT IN AERS: 2.0000 | INHALATION_SPRAY | Freq: Four times a day (QID) | RESPIRATORY_TRACT | 2 refills | Status: AC | PRN

## 2021-07-22 NOTE — Progress Notes (Signed)
Subjective:  Patient ID: Jenny Brown, female    DOB: 1971-03-26, 51 y.o.   MRN: [id number]  Patient Care Team: Baruch Gouty, FNP as PCP - General (Family Medicine)   Chief Complaint:  Hypertension, Wheezing, Depression, and Anxiety   HPI: Jenny Brown is a 51 y.o. female presenting on 07/22/2021 for Hypertension, Wheezing, Depression, and Anxiety   Pt presents today with uncontrolled HTN, worsening anxiety and depression, wheezing post COVID and an abnormal mole on face.   Hypertension This is a recurrent problem. The current episode started more than 1 year ago. The problem has been waxing and waning since onset. The problem is uncontrolled. Associated symptoms include anxiety, headaches, malaise/fatigue and shortness of breath. Pertinent negatives include no blurred vision, chest pain, neck pain, orthopnea, palpitations, peripheral edema, PND or sweats. There are no associated agents to hypertension. Risk factors for coronary artery disease include obesity and sedentary lifestyle. Past treatments include ACE inhibitors and diuretics. The current treatment provides mild improvement. Compliance problems include exercise and diet.  There is no history of CAD/MI or CVA.  Wheezing  This is a new (onset after COVID) problem. The current episode started more than 1 month ago. The problem occurs constantly. The problem has been unchanged. Associated symptoms include coughing, headaches and shortness of breath. Pertinent negatives include no abdominal pain, chest pain, chills, coryza, diarrhea, ear pain, fever, hemoptysis, neck pain, rash, rhinorrhea, sore throat, sputum production, swollen glands or vomiting. The symptoms are aggravated by any activity. She has tried nothing for the symptoms.  Depression        This is a recurrent problem.  The current episode started more than 1 year ago.   The problem occurs daily.  The problem has been waxing and waning since onset.  Associated symptoms include  decreased concentration, fatigue, helplessness, hopelessness, irritable, restlessness, decreased interest, appetite change, headaches and sad.  Associated symptoms include does not have insomnia, no body aches, no myalgias, no indigestion and no suicidal ideas.     The symptoms are aggravated by social issues.  Past treatments include SSRIs - Selective serotonin reuptake inhibitors.  Compliance with treatment is good.  Previous treatment provided mild relief.  Risk factors include major life event.   Past medical history includes anxiety.   Anxiety Presents for follow-up visit. Symptoms include decreased concentration, depressed mood, excessive worry, irritability, malaise, nervous/anxious behavior, restlessness and shortness of breath. Patient reports no chest pain, compulsions, confusion, dizziness, dry mouth, feeling of choking, hyperventilation, impotence, insomnia, muscle tension, nausea, obsessions, palpitations, panic or suicidal ideas. Symptoms occur most days. The severity of symptoms is severe and interfering with daily activities. The quality of sleep is fair.   Compliance with medications is 76-100%.   GAD 7 : Generalized Anxiety Score 07/22/2021 01/30/2021 04/05/2019  Nervous, Anxious, on Edge 3 3 0  Control/stop worrying 3 3 0  Worry too much - different things 3 3 0  Trouble relaxing 3 3 0  Restless 3 3 0  Easily annoyed or irritable 3 3 0  Afraid - awful might happen 3 3 0  Total GAD 7 Score 21 21 0  Anxiety Difficulty Extremely difficult Extremely difficult -    Depression screen [facility] 2/9 07/22/2021 01/30/2021 08/02/2019 07/12/2019 04/05/2019  Decreased Interest 3 2 2  0 0  Down, Depressed, Hopeless 3 2 1  0 0  PHQ - 2 Score 6 4 3  0 0  Altered sleeping 3 3 1  - 0  Tired, decreased energy 3  3 1 - 0  Change in appetite 3 3 0 - 0  Feeling bad or failure about yourself  3 2 1  - 0  Trouble concentrating 3 2 1  - 0  Moving slowly or fidgety/restless 3 2 0 - 0  Suicidal thoughts 0 0 0 - 0   PHQ-9 Score 24 19 7  - 0  Difficult doing work/chores Extremely dIfficult Extremely dIfficult - - -  Some recent data might be hidden     Relevant past medical, surgical, family, and social history reviewed and updated as indicated.  Allergies and medications reviewed and updated. Data reviewed: Chart in Epic.   Past Medical History:  Diagnosis Date   Essential hypertension, benign    Hypertension    OSA (obstructive sleep apnea)    Restless legs syndrome    Seizures ([place])     Past Surgical History:  Procedure Laterality Date   DILITATION & CURRETTAGE/HYSTROSCOPY WITH NOVASURE ABLATION N/A 07/01/2017   Procedure: DILATATION & CURETTAGE/HYSTEROSCOPY WITH NOVASURE ABLATION;  Surgeon: [name], MD;  Location: [facility];  Service: Gynecology;  Laterality: N/A;  dr. requests 10:00 start   TUBAL LIGATION      Social History   Socioeconomic History   Marital status: Married    Spouse name: Not on file   Number of children: Not on file   Years of education: Not on file   Highest education level: Not on file  Occupational History   Not on file  Tobacco Use   Smoking status: Never   Smokeless tobacco: Never  Vaping Use   Vaping Use: Never used  Substance and Sexual Activity   Alcohol use: No   Drug use: No   Sexual activity: Yes    Birth control/protection: Surgical    Comment: tubal and ablation  Other Topics Concern   Not on file  Social History Narrative   ** Merged History Encounter **       Lives with husband and 2 children in a one story home.  Works as a Optometrist.    Social Determinants of Health   Financial Resource Strain: Not on file  Food Insecurity: Not on file  Transportation Needs: Not on file  Physical Activity: Not on file  Stress: Not on file  Social Connections: Not on file  Intimate Partner Violence: Not on file    Outpatient Encounter Medications as of 07/22/2021  Medication Sig   albuterol (VENTOLIN HFA) 108 (90 Base) MCG/ACT  inhaler Inhale 2 puffs into the lungs every 6 (six) hours as needed for wheezing or shortness of breath.   lisinopril (ZESTRIL) 10 MG tablet Take 1 tablet (10 mg total) by mouth at bedtime.   lisinopril-hydrochlorothiazide (ZESTORETIC) 20-25 MG tablet Take 1 tablet by mouth daily.   sertraline (ZOLOFT) 100 MG tablet Take 1 tablet (100 mg total) by mouth daily.   [DISCONTINUED] ibuprofen (ADVIL) 400 MG tablet Take by mouth.   [DISCONTINUED] sertraline (ZOLOFT) 50 MG tablet Take 1 tablet (50 mg total) by mouth at bedtime.   [EXPIRED] methylPREDNISolone acetate (DEPO-MEDROL) injection 40 mg    No facility-administered encounter medications on file as of 07/22/2021.    No Active Allergies  Review of Systems  Constitutional:  Positive for activity change, appetite change, fatigue, irritability and malaise/fatigue. Negative for chills, diaphoresis, fever and unexpected weight change.  HENT:  Negative for congestion, ear pain, postnasal drip, rhinorrhea, sinus pressure, sinus pain and sore throat.   Eyes:  Negative for blurred vision, photophobia, pain,  discharge, redness, itching and visual disturbance.  Respiratory:  Positive for cough, shortness of breath and wheezing. Negative for apnea, hemoptysis, sputum production, choking, chest tightness and stridor.   Cardiovascular:  Negative for chest pain, palpitations, orthopnea, leg swelling and PND.  Gastrointestinal:  Negative for abdominal distention, abdominal pain, anal bleeding, blood in stool, constipation, diarrhea, nausea, rectal pain and vomiting.  Endocrine: Negative for cold intolerance, heat intolerance, polydipsia, polyphagia and polyuria.  Genitourinary:  Negative for decreased urine volume, difficulty urinating and impotence.  Musculoskeletal:  Negative for myalgias and neck pain.  Skin:  Negative for rash.  Neurological:  Positive for headaches. Negative for dizziness, tremors, seizures, syncope, facial asymmetry, speech difficulty,  weakness, light-headedness and numbness.  Psychiatric/Behavioral:  Positive for agitation, decreased concentration, depression, dysphoric mood and sleep disturbance. Negative for behavioral problems, confusion, hallucinations, self-injury and suicidal ideas. The patient is nervous/anxious. The patient does not have insomnia and is not hyperactive.   All other systems reviewed and are negative.      Objective:  BP (!) 157/85    Pulse 71    Temp 98.6 F (37 C)    Ht 5' (1.524 m)    Wt 165 lb (74.8 kg)    SpO2 99%    BMI 32.22 kg/m    Wt Readings from Last 3 Encounters:  07/22/21 165 lb (74.8 kg)  01/30/21 164 lb (74.4 kg)  08/02/19 166 lb (75.3 kg)    Physical Exam Vitals and nursing note reviewed.  Constitutional:      General: She is irritable. She is not in acute distress.    Appearance: Normal appearance. She is well-developed and well-groomed. She is obese. She is not ill-appearing, toxic-appearing or diaphoretic.  HENT:     Head: Normocephalic and atraumatic.     Jaw: There is normal jaw occlusion.     Right Ear: Hearing normal.     Left Ear: Hearing normal.     Nose: Nose normal.     Mouth/Throat:     Lips: Pink.     Mouth: Mucous membranes are moist.     Pharynx: Oropharynx is clear. Uvula midline.  Eyes:     General: Lids are normal.     Extraocular Movements: Extraocular movements intact.     Conjunctiva/sclera: Conjunctivae normal.     Pupils: Pupils are equal, round, and reactive to light.  Neck:     Thyroid: No thyroid mass, thyromegaly or thyroid tenderness.     Vascular: No carotid bruit or JVD.     Trachea: Trachea and phonation normal.  Cardiovascular:     Rate and Rhythm: Normal rate and regular rhythm.     Chest Wall: PMI is not displaced.     Pulses: Normal pulses.     Heart sounds: Normal heart sounds. No murmur heard.   No friction rub. No gallop.  Pulmonary:     Effort: Pulmonary effort is normal. No respiratory distress.     Breath sounds: No  stridor. Examination of the left-upper field reveals wheezing. Examination of the right-lower field reveals wheezing. Examination of the left-lower field reveals wheezing. Wheezing present. No rhonchi or rales.  Chest:     Chest wall: No tenderness.  Abdominal:     General: Bowel sounds are normal. There is no distension or abdominal bruit.     Palpations: Abdomen is soft. There is no hepatomegaly or splenomegaly.     Tenderness: There is no abdominal tenderness. There is no right CVA tenderness or left CVA  tenderness.     Hernia: No hernia is present.  Musculoskeletal:        General: Normal range of motion.     Cervical back: Normal range of motion and neck supple.     Right lower leg: No edema.     Left lower leg: No edema.  Lymphadenopathy:     Cervical: No cervical adenopathy.  Skin:    General: Skin is warm and dry.     Capillary Refill: Capillary refill takes less than 2 seconds.     Coloration: Skin is not cyanotic, jaundiced or pale.     Findings: Lesion present. No rash.       Neurological:     General: No focal deficit present.     Mental Status: She is alert and oriented to person, place, and time.     Cranial Nerves: No cranial nerve deficit.     Sensory: Sensation is intact. No sensory deficit.     Motor: Motor function is intact. No weakness.     Coordination: Coordination is intact. Coordination normal.     Gait: Gait is intact. Gait normal.     Deep Tendon Reflexes: Reflexes are normal and symmetric. Reflexes normal.  Psychiatric:        Attention and Perception: Attention and perception normal.        Mood and Affect: Affect normal. Mood is anxious.        Speech: Speech normal.        Behavior: Behavior normal. Behavior is cooperative.        Thought Content: Thought content normal.        Cognition and Memory: Cognition and memory normal.        Judgment: Judgment normal.    Results for orders placed or performed in visit on 01/30/21  CBC with  Differential/Platelet  Result Value Ref Range   WBC 7.2 3.4 - 10.8 x10E3/uL   RBC 5.46 (H) 3.77 - 5.28 x10E6/uL   Hemoglobin 13.6 11.1 - 15.9 g/dL   Hematocrit 42.6 34.0 - 46.6 %   MCV 78 (L) 79 - 97 fL   MCH 24.9 (L) 26.6 - 33.0 pg   MCHC 31.9 31.5 - 35.7 g/dL   RDW 13.9 11.7 - 15.4 %   Platelets 304 150 - 450 x10E3/uL   Neutrophils 57 Not Estab. %   Lymphs 31 Not Estab. %   Monocytes 7 Not Estab. %   Eos 4 Not Estab. %   Basos 1 Not Estab. %   Neutrophils Absolute 4.1 1.4 - 7.0 x10E3/uL   Lymphocytes Absolute 2.2 0.7 - 3.1 x10E3/uL   Monocytes Absolute 0.5 0.1 - 0.9 x10E3/uL   EOS (ABSOLUTE) 0.3 0.0 - 0.4 x10E3/uL   Basophils Absolute 0.0 0.0 - 0.2 x10E3/uL   Immature Granulocytes 0 Not Estab. %   Immature Grans (Abs) 0.0 0.0 - 0.1 x10E3/uL  CMP14+EGFR  Result Value Ref Range   Glucose 81 65 - 99 mg/dL   BUN 11 6 - 24 mg/dL   Creatinine, Ser 0.82 0.57 - 1.00 mg/dL   eGFR 88 >59 mL/min/1.73   BUN/Creatinine Ratio 13 9 - 23   Sodium 142 134 - 144 mmol/L   Potassium 4.6 3.5 - 5.2 mmol/L   Chloride 102 96 - 106 mmol/L   CO2 27 20 - 29 mmol/L   Calcium 10.1 8.7 - 10.2 mg/dL   Total Protein 7.5 6.0 - 8.5 g/dL   Albumin 4.4 3.8 - 4.8 g/dL   Globulin,  Total 3.1 1.5 - 4.5 g/dL   Albumin/Globulin Ratio 1.4 1.2 - 2.2   Bilirubin Total 0.3 0.0 - 1.2 mg/dL   Alkaline Phosphatase 81 44 - 121 IU/L   AST 14 0 - 40 IU/L   ALT 8 0 - 32 IU/L  Thyroid Panel With TSH  Result Value Ref Range   TSH 1.690 0.450 - 4.500 uIU/mL   T4, Total 7.1 4.5 - 12.0 ug/dL   T3 Uptake Ratio 24 24 - 39 %   Free Thyroxine Index 1.7 1.2 - 4.9  VITAMIN D 25 Hydroxy (Vit-D Deficiency, Fractures)  Result Value Ref Range   Vit D, 25-Hydroxy 26.3 (L) 30.0 - 100.0 ng/mL  Vitamin B12  Result Value Ref Range   Vitamin B-12 453 232 - 1,245 pg/mL  Microalbumin / creatinine urine ratio  Result Value Ref Range   Creatinine, Urine 42.2 Not Estab. mg/dL   Microalbumin, Urine 23.5 Not Estab. ug/mL    Microalb/Creat Ratio 56 (H) 0 - 29 mg/g creat  Lipid panel  Result Value Ref Range   Cholesterol, Total 219 (H) 100 - 199 mg/dL   Triglycerides 83 0 - 149 mg/dL   HDL 52 >39 mg/dL   VLDL Cholesterol Cal 15 5 - 40 mg/dL   LDL Chol Calc (NIH) 152 (H) 0 - 99 mg/dL   Chol/HDL Ratio 4.2 0.0 - 4.4 ratio     X-Ray: CXR: No acute findings. Preliminary x-ray reading by Monia Pouch, FNP-C, [facility].   Pertinent labs & imaging results that were available during my care of the patient were reviewed by me and considered in my medical decision making.  Assessment & Plan:  H[place] was seen today for hypertension, wheezing, depression and anxiety.  Diagnoses and all orders for this visit:  Uncontrolled hypertension BP not controlled. Changes were made in regimen today, add additional lisinopril dose at night for better control. Goal BP is 130/80. Pt aware to report any persistent high or low readings. DASH diet and exercise encouraged. Exercise at least 150 minutes per week and increase as tolerated. Goal BMI > 25. Stress management encouraged. Avoid nicotine and tobacco product use. Avoid excessive alcohol and NSAID's. Avoid more than 2000 mg of sodium daily. Medications as prescribed. Follow up as scheduled.  -     CBC with Differential/Platelet -     CMP14+EGFR -     Thyroid Panel With TSH -     Lipid panel -     lisinopril (ZESTRIL) 10 MG tablet; Take 1 tablet (10 mg total) by mouth at bedtime.  Depression, recurrent ([place]) GAD (generalized anxiety disorder) No SI or HI, will increase dose of zoloft and repeat thyroid function. Report any new, worsening, or persistent symptoms. Follow up in 3-4 weeks or sooner if warranted.  -     sertraline (ZOLOFT) 100 MG tablet; Take 1 tablet (100 mg total) by mouth daily. -     Thyroid Panel With TSH  Wheezing Post COVID. Will obtain chest xray. Burst with steroids and write for albuterol as needed for cough and wheezing. Further treatment pending imaging  results. Follow up in 2-3 weeks or sooner if not improving.  -     DG Chest 2 View; Future -     albuterol (VENTOLIN HFA) 108 (90 Base) MCG/ACT inhaler; Inhale 2 puffs into the lungs every 6 (six) hours as needed for wheezing or shortness of breath. -     methylPREDNISolone acetate (DEPO-MEDROL) injection 40 mg  Vitamin D deficiency Will  check labs today and adjust repletion if warranted.  -     VITAMIN D 25 Hydroxy (Vit-D Deficiency, Fractures)  Atypical mole On face so will refer to derm. -     Ambulatory referral to Dermatology     Continue all other maintenance medications.  Follow up plan: Return in about 4 weeks (around 08/19/2021), or if symptoms worsen or fail to improve, for HTN.   Continue healthy lifestyle choices, including diet (rich in fruits, vegetables, and lean proteins, and low in salt and simple carbohydrates) and exercise (at least 30 minutes of moderate physical activity daily).  Educational handout given for DASH diet, HTN  The above assessment and management plan was discussed with the patient. The patient verbalized understanding of and has agreed to the management plan. Patient is aware to call the clinic if they develop any new symptoms or if symptoms persist or worsen. Patient is aware when to return to the clinic for a follow-up visit. Patient educated on when it is appropriate to go to the emergency department.   Monia Pouch, FNP-C [facility] [phone]

## 2021-07-22 NOTE — Patient Instructions (Signed)

## 2021-07-23 ENCOUNTER — Other Ambulatory Visit: Payer: Self-pay | Admitting: Family Medicine

## 2021-07-23 DIAGNOSIS — Z1231 Encounter for screening mammogram for malignant neoplasm of breast: Secondary | ICD-10-CM

## 2021-07-23 LAB — CBC WITH DIFFERENTIAL/PLATELET
Basophils Absolute: 0.1 10*3/uL (ref 0.0–0.2)
Basos: 1 %
EOS (ABSOLUTE): 0.3 10*3/uL (ref 0.0–0.4)
Eos: 6 %
Hematocrit: 40.1 % (ref 34.0–46.6)
Hemoglobin: 13 g/dL (ref 11.1–15.9)
Immature Grans (Abs): 0 10*3/uL (ref 0.0–0.1)
Immature Granulocytes: 0 %
Lymphocytes Absolute: 1.8 10*3/uL (ref 0.7–3.1)
Lymphs: 35 %
MCH: 25 pg — ABNORMAL LOW (ref 26.6–33.0)
MCHC: 32.4 g/dL (ref 31.5–35.7)
MCV: 77 fL — ABNORMAL LOW (ref 79–97)
Monocytes Absolute: 0.4 10*3/uL (ref 0.1–0.9)
Monocytes: 7 %
Neutrophils Absolute: 2.6 10*3/uL (ref 1.4–7.0)
Neutrophils: 51 %
Platelets: 270 10*3/uL (ref 150–450)
RBC: 5.21 x10E6/uL (ref 3.77–5.28)
RDW: 13.5 % (ref 11.7–15.4)
WBC: 5.2 10*3/uL (ref 3.4–10.8)

## 2021-07-23 LAB — CMP14+EGFR
ALT: 13 IU/L (ref 0–32)
AST: 15 IU/L (ref 0–40)
Albumin/Globulin Ratio: 1.8 (ref 1.2–2.2)
Albumin: 4.3 g/dL (ref 3.8–4.8)
Alkaline Phosphatase: 65 IU/L (ref 44–121)
BUN/Creatinine Ratio: 17 (ref 9–23)
BUN: 13 mg/dL (ref 6–24)
Bilirubin Total: 0.3 mg/dL (ref 0.0–1.2)
CO2: 24 mmol/L (ref 20–29)
Calcium: 9.4 mg/dL (ref 8.7–10.2)
Chloride: 103 mmol/L (ref 96–106)
Creatinine, Ser: 0.78 mg/dL (ref 0.57–1.00)
Globulin, Total: 2.4 g/dL (ref 1.5–4.5)
Glucose: 92 mg/dL (ref 70–99)
Potassium: 4.1 mmol/L (ref 3.5–5.2)
Sodium: 139 mmol/L (ref 134–144)
Total Protein: 6.7 g/dL (ref 6.0–8.5)
eGFR: 93 mL/min/{1.73_m2} (ref >59)

## 2021-07-23 LAB — THYROID PANEL WITH TSH
Free Thyroxine Index: 1.7 (ref 1.2–4.9)
T3 Uptake Ratio: 26 % (ref 24–39)
T4, Total: 6.6 ug/dL (ref 4.5–12.0)
TSH: 1.46 u[IU]/mL (ref 0.450–4.500)

## 2021-07-23 LAB — VITAMIN D 25 HYDROXY (VIT D DEFICIENCY, FRACTURES): Vit D, 25-Hydroxy: 37.4 ng/mL (ref 30.0–100.0)

## 2021-07-23 LAB — LIPID PANEL
Chol/HDL Ratio: 4.2 ratio (ref 0.0–4.4)
Cholesterol, Total: 191 mg/dL (ref 100–199)
HDL: 45 mg/dL (ref >39)
LDL Chol Calc (NIH): 128 mg/dL — ABNORMAL HIGH (ref 0–99)
Triglycerides: 97 mg/dL (ref 0–149)
VLDL Cholesterol Cal: 18 mg/dL (ref 5–40)

## 2021-08-04 ENCOUNTER — Inpatient Hospital Stay: Admission: RE | Admit: 2021-08-04 | Payer: BC Managed Care – PPO | Source: Ambulatory Visit

## 2021-08-19 ENCOUNTER — Ambulatory Visit: Payer: BC Managed Care – PPO | Admitting: Family Medicine

## 2022-01-28 ENCOUNTER — Ambulatory Visit: Payer: BC Managed Care – PPO | Admitting: Family Medicine

## 2022-06-17 LAB — HM MAMMOGRAPHY

## 2022-08-31 ENCOUNTER — Encounter: Payer: Self-pay | Admitting: Family Medicine

## 2023-09-02 ENCOUNTER — Encounter: Payer: Self-pay | Admitting: Family Medicine

## 2023-12-29 NOTE — Telephone Encounter (Signed)
 Copied from CRM [id number]. Topic: Clinical - Red Word Triage >> Dec 29, 2023  2:10 PM Powell HERO wrote: Red Word that prompted transfer to Nurse Triage: Patient is having pain under her right arm, states she has cellulitis. Extreme pain and had to leave work.

## 2023-12-29 NOTE — Telephone Encounter (Signed)
 This encounter was created in error - please disregard.
